# Patient Record
Sex: Female | Born: 1980 | Race: White | Hispanic: No | Marital: Married | Smoking: Current some day smoker
Health system: Southern US, Community
[De-identification: ages and names within clinical notes are randomized; demographics above are authoritative.]

## PROBLEM LIST (undated history)

## (undated) ENCOUNTER — Inpatient Hospital Stay (HOSPITAL_COMMUNITY): Payer: Self-pay

## (undated) DIAGNOSIS — K219 Gastro-esophageal reflux disease without esophagitis: Secondary | ICD-10-CM

## (undated) DIAGNOSIS — N390 Urinary tract infection, site not specified: Secondary | ICD-10-CM

## (undated) DIAGNOSIS — IMO0002 Reserved for concepts with insufficient information to code with codable children: Secondary | ICD-10-CM

## (undated) DIAGNOSIS — O09299 Supervision of pregnancy with other poor reproductive or obstetric history, unspecified trimester: Secondary | ICD-10-CM

## (undated) DIAGNOSIS — B999 Unspecified infectious disease: Secondary | ICD-10-CM

## (undated) DIAGNOSIS — Z8632 Personal history of gestational diabetes: Secondary | ICD-10-CM

## (undated) HISTORY — PX: DILATION AND CURETTAGE OF UTERUS: SHX78

## (undated) HISTORY — PX: INDUCED ABORTION: SHX677

## (undated) HISTORY — DX: Unspecified infectious disease: B99.9

## (undated) HISTORY — DX: Gastro-esophageal reflux disease without esophagitis: K21.9

## (undated) HISTORY — DX: Reserved for concepts with insufficient information to code with codable children: IMO0002

---

## 1999-05-08 ENCOUNTER — Emergency Department (HOSPITAL_COMMUNITY): Admission: EM | Admit: 1999-05-08 | Discharge: 1999-05-08 | Payer: Self-pay | Admitting: Emergency Medicine

## 1999-10-13 DIAGNOSIS — R87619 Unspecified abnormal cytological findings in specimens from cervix uteri: Secondary | ICD-10-CM

## 1999-10-13 DIAGNOSIS — IMO0002 Reserved for concepts with insufficient information to code with codable children: Secondary | ICD-10-CM

## 1999-10-13 HISTORY — PX: LEEP: SHX91

## 1999-10-13 HISTORY — DX: Unspecified abnormal cytological findings in specimens from cervix uteri: R87.619

## 1999-10-13 HISTORY — DX: Reserved for concepts with insufficient information to code with codable children: IMO0002

## 1999-11-18 ENCOUNTER — Emergency Department (HOSPITAL_COMMUNITY): Admission: EM | Admit: 1999-11-18 | Discharge: 1999-11-18 | Payer: Self-pay | Admitting: Emergency Medicine

## 2000-07-30 ENCOUNTER — Encounter (INDEPENDENT_AMBULATORY_CARE_PROVIDER_SITE_OTHER): Payer: Self-pay

## 2000-07-30 ENCOUNTER — Other Ambulatory Visit: Admission: RE | Admit: 2000-07-30 | Discharge: 2000-07-30 | Payer: Self-pay | Admitting: Obstetrics

## 2000-10-31 ENCOUNTER — Emergency Department (HOSPITAL_COMMUNITY): Admission: EM | Admit: 2000-10-31 | Discharge: 2000-10-31 | Payer: Self-pay | Admitting: Emergency Medicine

## 2000-10-31 ENCOUNTER — Encounter: Payer: Self-pay | Admitting: Emergency Medicine

## 2000-12-10 ENCOUNTER — Other Ambulatory Visit: Admission: RE | Admit: 2000-12-10 | Discharge: 2000-12-10 | Payer: Self-pay | Admitting: Obstetrics & Gynecology

## 2000-12-10 ENCOUNTER — Encounter: Admission: RE | Admit: 2000-12-10 | Discharge: 2000-12-10 | Payer: Self-pay | Admitting: Obstetrics & Gynecology

## 2000-12-28 ENCOUNTER — Encounter: Admission: RE | Admit: 2000-12-28 | Discharge: 2000-12-28 | Payer: Self-pay | Admitting: Obstetrics & Gynecology

## 2002-01-13 ENCOUNTER — Emergency Department (HOSPITAL_COMMUNITY): Admission: EM | Admit: 2002-01-13 | Discharge: 2002-01-13 | Payer: Self-pay | Admitting: Emergency Medicine

## 2002-01-13 ENCOUNTER — Encounter: Payer: Self-pay | Admitting: Emergency Medicine

## 2002-04-24 ENCOUNTER — Emergency Department (HOSPITAL_COMMUNITY): Admission: EM | Admit: 2002-04-24 | Discharge: 2002-04-24 | Payer: Self-pay | Admitting: Emergency Medicine

## 2002-09-02 ENCOUNTER — Emergency Department (HOSPITAL_COMMUNITY): Admission: EM | Admit: 2002-09-02 | Discharge: 2002-09-02 | Payer: Self-pay | Admitting: Emergency Medicine

## 2003-01-03 ENCOUNTER — Emergency Department (HOSPITAL_COMMUNITY): Admission: EM | Admit: 2003-01-03 | Discharge: 2003-01-04 | Payer: Self-pay | Admitting: Emergency Medicine

## 2003-05-27 ENCOUNTER — Emergency Department (HOSPITAL_COMMUNITY): Admission: EM | Admit: 2003-05-27 | Discharge: 2003-05-27 | Payer: Self-pay | Admitting: Emergency Medicine

## 2004-02-21 ENCOUNTER — Inpatient Hospital Stay (HOSPITAL_COMMUNITY): Admission: AD | Admit: 2004-02-21 | Discharge: 2004-02-22 | Payer: Self-pay | Admitting: Family Medicine

## 2004-11-13 ENCOUNTER — Encounter (INDEPENDENT_AMBULATORY_CARE_PROVIDER_SITE_OTHER): Payer: Self-pay | Admitting: *Deleted

## 2004-11-13 ENCOUNTER — Ambulatory Visit (HOSPITAL_COMMUNITY): Admission: AD | Admit: 2004-11-13 | Discharge: 2004-11-13 | Payer: Self-pay | Admitting: *Deleted

## 2005-02-25 ENCOUNTER — Emergency Department (HOSPITAL_COMMUNITY): Admission: EM | Admit: 2005-02-25 | Discharge: 2005-02-25 | Payer: Self-pay | Admitting: Emergency Medicine

## 2005-10-25 ENCOUNTER — Inpatient Hospital Stay (HOSPITAL_COMMUNITY): Admission: AD | Admit: 2005-10-25 | Discharge: 2005-10-25 | Payer: Self-pay | Admitting: Family Medicine

## 2006-08-21 ENCOUNTER — Emergency Department (HOSPITAL_COMMUNITY): Admission: EM | Admit: 2006-08-21 | Discharge: 2006-08-22 | Payer: Self-pay | Admitting: Emergency Medicine

## 2006-10-12 HISTORY — PX: WISDOM TOOTH EXTRACTION: SHX21

## 2008-02-08 ENCOUNTER — Encounter: Admission: RE | Admit: 2008-02-08 | Discharge: 2008-02-08 | Payer: Self-pay | Admitting: Obstetrics and Gynecology

## 2008-02-22 ENCOUNTER — Ambulatory Visit (HOSPITAL_COMMUNITY): Admission: RE | Admit: 2008-02-22 | Discharge: 2008-02-22 | Payer: Self-pay | Admitting: Obstetrics and Gynecology

## 2008-03-13 ENCOUNTER — Inpatient Hospital Stay (HOSPITAL_COMMUNITY): Admission: AD | Admit: 2008-03-13 | Discharge: 2008-03-13 | Payer: Self-pay | Admitting: Obstetrics and Gynecology

## 2008-03-17 ENCOUNTER — Inpatient Hospital Stay (HOSPITAL_COMMUNITY): Admission: AD | Admit: 2008-03-17 | Discharge: 2008-03-17 | Payer: Self-pay | Admitting: Obstetrics and Gynecology

## 2008-03-20 ENCOUNTER — Inpatient Hospital Stay (HOSPITAL_COMMUNITY): Admission: AD | Admit: 2008-03-20 | Discharge: 2008-04-03 | Payer: Self-pay | Admitting: Obstetrics and Gynecology

## 2008-03-29 ENCOUNTER — Encounter: Payer: Self-pay | Admitting: Obstetrics and Gynecology

## 2009-10-08 ENCOUNTER — Inpatient Hospital Stay (HOSPITAL_COMMUNITY): Admission: RE | Admit: 2009-10-08 | Discharge: 2009-10-10 | Payer: Self-pay | Admitting: Obstetrics and Gynecology

## 2010-11-02 ENCOUNTER — Encounter: Payer: Self-pay | Admitting: *Deleted

## 2011-01-12 LAB — COMPREHENSIVE METABOLIC PANEL
Albumin: 2.3 g/dL — ABNORMAL LOW (ref 3.5–5.2)
Alkaline Phosphatase: 232 U/L — ABNORMAL HIGH (ref 39–117)
CO2: 19 mEq/L (ref 19–32)
Calcium: 8.4 mg/dL (ref 8.4–10.5)
Chloride: 105 mEq/L (ref 96–112)
GFR calc Af Amer: >60 mL/min (ref >60)
GFR calc non Af Amer: >60 mL/min (ref >60)
Glucose, Bld: 95 mg/dL (ref 70–99)
Total Bilirubin: 0.3 mg/dL (ref 0.3–1.2)
Total Protein: 6 g/dL (ref 6.0–8.3)

## 2011-01-12 LAB — CBC
Hemoglobin: 12.2 g/dL (ref 12.0–15.0)
MCHC: 33.6 g/dL (ref 30.0–36.0)
MCHC: 33.8 g/dL (ref 30.0–36.0)
MCV: 88.5 fL (ref 78.0–100.0)
Platelets: 144 10*3/uL — ABNORMAL LOW (ref 150–400)
RBC: 3.39 MIL/uL — ABNORMAL LOW (ref 3.87–5.11)
RBC: 4.1 MIL/uL (ref 3.87–5.11)

## 2011-01-12 LAB — LACTATE DEHYDROGENASE: LDH: 137 U/L (ref 94–250)

## 2011-01-12 LAB — HEPATIC FUNCTION PANEL
ALT: 38 U/L — ABNORMAL HIGH (ref 0–35)
Albumin: 1.9 g/dL — ABNORMAL LOW (ref 3.5–5.2)
Alkaline Phosphatase: 177 U/L — ABNORMAL HIGH (ref 39–117)
Bilirubin, Direct: 0.1 mg/dL (ref 0.0–0.3)
Total Bilirubin: <0.1 mg/dL — ABNORMAL LOW (ref 0.3–1.2)

## 2011-01-12 LAB — RAPID HIV SCREEN (WH-MAU): Rapid HIV Screen: NONREACTIVE

## 2011-02-24 NOTE — Consult Note (Signed)
NAMESTEPHANINE, Richards               ACCOUNT NO.:  [id number]   MEDICAL RECORD NO.:  [id number]          PATIENT TYPE:  INP   LOCATION:  9156                          FACILITY:  [facility]   PHYSICIAN:  Crist Fat. Rivard, M.D. DATE OF BIRTH:  02/13/1981   DATE OF CONSULTATION:  03/26/2008  DATE OF DISCHARGE:                                 CONSULTATION   REASON FOR CONSULTATION:  Secondary to elevated liver function tests.   Thank you for allowing me to see [name] today, per your request for  consultation.  As you know, she is a 30 year old G 4, P 0, 0, 3, 0,  currently at 34 weeks and 3 days, admitted on March 20, 2008, secondary to  concerns for preterm labor.  Her past medical history is notable for a  LEEP, as well as being diagnosed with gestational diabetes.  During her  hospitalization she has been noted to remain quiescent and not change  her cervix from a cervix of 1 cm dilated, 80% effaced and 0 station.  During this hospitalization she has received magnesium sulfate for  tocolysis, as well as has received a course of betamethasone.  She  continued to receive Glyburide until her blood sugars were elevated and  was subsequently converted to insulin.   PAST MEDICAL HISTORY:  Is otherwise unremarkable aside from that which  was mentioned above.   ALLERGIES:  No known drug allergies.   OB HISTORY:  Notable for a SAB in 2001.  An elective AB in 2002, and an  SAB in 2006.   GYN HISTORY:  Notable for an abnormal Pap with a LEEP in 2001.   SOCIAL HISTORY:  Notable for tobacco use.   PAST SURGICAL HISTORY:  Notable for the LEEP in 2001, and a D&C in 2002.   FAMILY HISTORY:  Noncontributory.   CURRENT PREGNANCY:  Was uncomplicated until her Glucola at 26 weeks  revealed an abnormal value and a 3-hour GGT confirmed her gestational  diabetes.  She has been on Glyburide for glycemia control since the  diagnosis.   OBJECTIVE:  VITAL SIGNS:  Stable.  She is afebrile.  Blood  pressures  100's to 110's/60's to 70's.   LABORATORY DATA:  Hepatitis panel which was negative for Hepatitis-C and  Hepatitis-B, but also her 24-hour urine which was completed on March 24, 2008, revealing 74 mg of protein noted.  Her comprehensive metabolic  panel reveals her AST and ALT have been gradually rising over the course  of the last week until now with AST of 100 units per liter and her ALT  278 units per liter.  This is elevated, compared to her levels on March 22, 2008, when the AST was normal at 33 and her ALT was slightly  elevated at 55.  Her platelet counts have remained stable.  Today it is  241,000 which is a slight decrease from 270,000 on March 22, 2008.   Further discussion with the patient reveals that she does not complain  of any symptoms associated with pre-eclampsia.  She denies any  medication use, aside from  the Glyburide which she has been on for some  time and has discontinued it for nearly 48 hours.   ASSESSMENT:  1. Intrauterine pregnancy at 34 weeks and 3 days, with elevated liver      function studies.  2. Gestational diabetes, currently on insulin.  3. Pre-term labor, currently stable.   PLAN:  1. Elevated liver function studies:  Although this is an isolated      finding, she is a very likely candidate to possibly have atypical      HELLP syndrome, although I realize that she does not have any      proteinuria or any thrombocytopenia and her blood pressures have      remained stable.  This is high on my differential at this time.      Other considerations include acute fatty liver; however, clinically      she appears quite stable, with regard to the typical presentation      of acute fatty liver with significant decompensation.  Other      considerations include hepatitis, which seems unlikely with the      negative hepatitis panel, as well as drug-induced, and again this      seems unlikely given her denial of any medications, as well as the       recent discontinuation of the Glyburide.  I recommend that we re-      evaluate her liver function tests tomorrow morning, as well as      repeat another 24-hour urine protein and follow her blood pressures      over the course of the evening.  Based on these findings tomorrow,      we can reassess the status and develop a plan with regards to      pregnancy management and patient care.  2. Gestational diabetes:  She is currently receiving insulin for her      glycemic control.  3. Pre-term labor:  She has remained stable, without any evidence of      labor progression.  She has also received a course of natal      corticosteroids.  4. Given the constellation of findings, I have recommended that we      also have a NICU consultation, for the possibility that she may      need to be delivered.  This can be postponed until our findings      from our laboratory assessments in the morning.   These findings and plan were discussed with Dr. Crist Fat. Rivard earlier  today after my consultation.   Again, thank you for allowing me to see [name] today in  consultation.  The total face to face time was one hour.  If you have  any further questions, please free to contact me.  I look forward to  continuing to follow her during her current hospitalization.      Toma Copier, MD  Electronically Signed     ______________________________  Crist Fat Rivard, M.D.    SJ/MEDQ  D:  03/26/2008  T:  03/26/2008  Job:  [id number]

## 2011-02-24 NOTE — Consult Note (Signed)
NAME:  Kelsey Richards, Kelsey Richards NO.:  [id number]   MEDICAL RECORD NO.:  [id number]          PATIENT TYPE:  OUT   LOCATION:  MFM                           FACILITY:  [facility]   PHYSICIAN:  Graylin Shiver, M.D.   DATE OF BIRTH:  September 29, 1981   DATE OF CONSULTATION:  03/29/2008  DATE OF DISCHARGE:                                 CONSULTATION   We were asked to see [name] today in consultation for elevated  liver enzymes by Dr. Jena Gauss.   HISTORY OF PRESENT ILLNESS:  This is a 30 year old female who was  admitted for preterm labor on March 20, 2008.  She was given betamethasone  and magnesium sulfate to slow and stop the labor.  At the time of her  admission, her alk phos was slightly elevated; however, this is not  unusual for pregnancy.  The patient denies any abdominal pain, vomiting,  change in bowel habits, herbal supplements, or recreational drugs,  history of liver disease in herself or in her family.  She reports  severe GERD symptoms, palmar erythema that began at 19 weeks, and has  bilateral chronic itch that seems to be in her extremities as well as on  her back, mild constipation, and history of birth control pill use.  The  patient has maintained a normal to low blood pressure throughout her  stay.  Her platelets are normal.   Past medical history is significant for two spontaneous abortions, one  elective abortion, an LEEP procedure, and gestational diabetes.   Current medications include glyburide, terbutaline, a beta-2 agonist,  Tums, and prenatal vitamins.   She has no known drug allergies.   REVIEW OF SYSTEMS:  As per HPI.   Social history is positive for tobacco, but negative for drugs and  alcohol.  She is a single parent.   Her family history is negative for liver disease, pancreatic disease.  Her father did passed recently with throat cancer.   On physical exam, she is alert and oriented in no apparent distress.  She is pleasant to speak with.  She  has no obvious jaundice.  Her  temperature is 97.9, pulse 96, respirations 20, and blood pressure is  122/76.  Heart has a slightly tachy rate but regular rhythm.  No  murmurs, rubs, or gallops appreciated.  Her lungs are clear to  auscultation bilaterally.  Her abdomen is gravid, firm, nontender, has  good bowel sounds.  She does have bilateral palmar erythema.  She has no  spider telangiectasias.   LABORATORY DATA:  Hepatitis A, B, and C are negative.  Hepatitis B  surface antigen negative.  Hep C antibody negative.  BMET is within  normal limits.  AST is 140, ALT 402, alk phos 224, total bilirubin 0.6.  Coags were normal.  Antimitochondrial antibody is pending.  Radiological  exams include an abdominal ultrasound which showed no stones, no fatty  liver, no CBD dilatation, and she had a normal appearing pancreas.   ASSESSMENT:  Dr. Herbert Moors seen and examined the patient, collected a  history, and reviewed the chart.  His  impression is at this point in  time she appears not to be ill, yet has elevated LFTs.  The following  considerations have been followed.  1. Viral hepatitis ruled out by negative hepatitis serologies.  2. Gallstones ruled out by negative abdominal ultrasound.  3. Fatty liver ruled out with negative ultrasound.  4. Cytomegalovirus hepatitis.  We will check Cytomegalovirus titers.  5. Primary biliary cirrhosis.  We will check an AMA.  6. Autoimmune hepatitis.  We will check an ANA.  7. Hemolysis, elevated liver enzymes, and low platelet count does not      fit clinically.  Platelets are okay.  She is not hypertensive and      there is no evidence of hemolysis.  8. Fatty liver of pregnancy.  It does not clinically fit.  She is not      ill.  She has no evidence of hepatic failure.  Ammonia is okay.      Coagulation studies are okay.  9. Subacute Budd-Chiari syndrome.  We will rule this out with Doppler      ultrasounds.  10.Cholestasis of pregnancy.  This  diagnosis best fits as picture.  I      suspect this is what we are dealing with and it will resolve after      the patient has delivered her baby.   Thanks very much for this consultation.  We will follow with you.      Stephani Police, PA    ______________________________  Graylin Shiver, M.D.    MLY/MEDQ  D:  03/29/2008  T:  03/30/2008  Job:  [id number]

## 2011-02-24 NOTE — H&P (Signed)
Kelsey Richards, Kelsey Richards               ACCOUNT NO.:  [id number]   MEDICAL RECORD NO.:  [id number]          PATIENT TYPE:  INP   LOCATION:  9156                          FACILITY:  [facility]   PHYSICIAN:  Osborn Coho, M.D.   DATE OF BIRTH:  1981-03-20   DATE OF ADMISSION:  03/20/2008  DATE OF DISCHARGE:                              HISTORY & PHYSICAL   This is a 30 year old gravida 4, para 0-0-3-0 at 33-4/7 weeks who  presents for evaluation of preterm labor.  The patient feels some, but  not all of her contractions.  She denies leaking or bleeding and reports  positive fetal movement.  Pregnancy has been followed by Dr. Su Hilt and  remarkable for  1. History of LEEP.  2. Smoker.  3. History of SAB x2 and EAB x1.  4. Gestational diabetes.  5. Group B strep negative.   ALLERGIES:  None.   OB HISTORY:  Remarkable for spontaneous abortion in 2001, elective  abortion in 2002, and a spontaneous abortion in 2006.   MEDICAL HISTORY:  Remarkable for abnormal Pap with a LEEP in 2001 and  history of smoking.   SURGICAL HISTORY:  Remarkable for a LEEP in 2001 and a D&C in 2002.   FAMILY HISTORY:  Remarkable for grandmother with heart disease,  hypertension, and diabetes.  Mother with skin cancer, and father with  throat cancer.   GENETIC HISTORY:  Remarkable for a cousin with CP.   SOCIAL HISTORY:  The patient is single.  Father of the baby is involved  and supportive.  She does not report a religious affiliation.  She  denies any alcohol or drug abuse, but does smoke cigarettes.   PRENATAL LABS:  Hemoglobin 13.5, platelets 185.  Blood type A+, antibody  screen negative.  Toxin negative, RPR nonreactive, rubella immune.  Hepatitis negative, HIV negative.  Cystic fibrosis negative, varicella  negative.   HISTORY OF CURRENT PREGNANCY:  The patient entered care at [redacted] weeks  gestation.  She had an ultrasound that was normal and another ultrasound  at 9 weeks that was normal.  Her first  trimester screen was normal.  Cervical length at 19 weeks is 2.78, and her anatomy scan was normal at  that time.  Her Glucola at 26 weeks was elevated at 183, 3-hour GTT was  elevated, and so, she was referred for diabetic teaching and placed on a  diet, which was insufficient to control her sugars, so she was placed on  glyburide and later the glyburide had to be increased to 10 mg per day  at 28 weeks.  Ultrasound at 28 weeks showed normal growth and normal  fluid.  She started her nonstress tests at 30 weeks and sugars remained  in good control after that time, and she presents today with  contractions.   OBJECTIVE:  VITAL SIGNS:  Stable, afebrile.  HEENT:  Within normal limits.  Thyroid normal, not enlarged.  CHEST:  Clear to auscultation.  HEART:  Regular rate and rhythm.  ABDOMEN:  Gravid, 33-cm vertex [place].  EMF shows reactive fetal heart  rate with contractions every  3-9 minutes, which are mild.  Cervix is 1  cm, 80%, -1 to 0 station with a vertex presentation.  This represents a  change from 3 days ago when her cervix was closed and not effaced.  Group B strep is negative.  Fetal fibronectin was negative on Mar 09, 2008.  CBG after lunch was 122.  EXTREMITIES:  Within normal limits.   ASSESSMENT:  1. Intrauterine pregnancy at 33-4/7 weeks.  2. Preterm labor.  3. Gestational diabetes.   PLAN:  1. Consulted Dr. Su Hilt who wants to admit the patient.  2. Magnesium sulfate tocolysis.  3. Betamethasone series.  4. Glyburide for controlled blood sugar monitoring, and we will call      the physician with sugars greater than or equal to 150.  Further      orders to follow.      Marie L. Williams, C.N.M.      Osborn Coho, M.D.  Electronically Signed    MLW/MEDQ  D:  03/20/2008  T:  03/21/2008  Job:  [id number]

## 2011-02-27 NOTE — Discharge Summary (Signed)
Kelsey Richards, Kelsey Richards               ACCOUNT NO.:  [id number]   MEDICAL RECORD NO.:  [id number]          PATIENT TYPE:  INP   LOCATION:  9110                          FACILITY:  [facility]   PHYSICIAN:  Osborn Coho, M.D.   DATE OF BIRTH:  01/06/1981   DATE OF ADMISSION:  03/20/2008  DATE OF DISCHARGE:  04/03/2008                               DISCHARGE SUMMARY   ADMITTING DIAGNOSES:  1. Intrauterine pregnancy at 33-4/7 weeks.  2. Preterm labor.  3. Gestational diabetes.   DISCHARGE DIAGNOSES:  1. Preterm delivery at 35-3/7 weeks secondary to induction of labor      for worsening elevations in liver function tests (suspected      cholestasis of pregnancy).  2. Status post spontaneous vaginal delivery with findings of viable      female infant on April 01, 2008, at 1:27 a.m. with weight equal to 5      pounds 12 ounces, (which was 2615 grams), 20 inches in length with      Apgars of 9 at 1 minute and 9 at 5 minutes.  3. Elevated but improving liver function tests.  4. Breast-feeding along with supplementation.   PROCEDURES:  1. Magnesium sulfate infusion on admission for preterm labor and to      allow for steroid course.  2. Betamethasone course x2.  3. Epidural.  4. GI workup with abdominal ultrasound.   HOSPITAL COURSE:  [name] is a 30 year old gravida 4, para 0-0-3-0  who presented on the day of admission of March 20, 2008, at 33-4/7 weeks  for preterm labor evaluation.  The patient stated that she was feeling  some contractions but not all that were noted on the monitor.  She  denied leakage of fluid or vaginal bleeding.  Reported good fetal  movement.  Her pregnancy had been followed by the MD service at [facility],  Dr. Su Hilt was her primary, and her history and pregnancy were  remarkable for:  1. History of LEEP.  2. Smoker.  3. History of SAB x2 and EAB x1.  4. Gestational diabetes.  5. Group B strep negative.   On assessment, the patient's cervix was 1 cm, 80% and -1 to  0 station  with a vertex presentation, which was a change from 3 days prior to her  admission, when cervix was closed and not effaced.  She had a reactive  fetal heart tracing with contractions every 3 to 9 minutes, which were  mild on palpation.  She had a negative fetal fibronectin that was on Mar 09, 2008.  A CBG after lunch before her arrival was 122.  The patient  was admitted under Dr. Osborn Coho for inpatient status to antenatal  unit to begin magnesium sulfate infusion for tocolysis and was to obtain  betamethasone series for fetal lung maturity.  She was also placed on  glyburide for blood sugar control,  and sugars were to be monitored.  Just after magnesium sulfate infusion begun, the patient reported that  contractions had spaced and was feeling better.  The following morning,  June 10, the patient continued  doing well.  She was aware just of some  sporadic contractions.  Vital signs were stable.  She was afebrile.  Her  morning CBG was 176.  The first dose of her betamethasone was on the 9th  at 7:00 p.m.  Her lungs were clear.  She was having 1 to 2 contractions  per hour on the magnesium sulfate.  Fetal heart tracing was reactive  without decels.  Dr. Su Hilt checked her cervix that morning, and it  remained 1 cm, 80% and -1.  DTRs were 2+, and the magnesium sulfate  continued to infuse to allow for second dose of betamethasone.  Her  glyburide that day was increased to 15 mg p.o. daily.  Later in the day,  the patient was seen by Dr. Normand Sloop and was also placed on a Humalog  sliding scale insulin.  She had a social work consult on the 10th as  well.  Later on on the 10th, about just before 9:00 p.m., the patient  was feeling dizzy but no shortness of breath, no chest pain.  Vital  signs were stable.  Reactive NST.  Blood sugar was 149.  Magnesium level  was checked, and the plan was made to continue to monitor closely.  On  June 11, the patient was stable.  She did not  have any complaints over  the night.  Fasting blood sugar was 126.  The EFM showed 4 to 5 moderate  variable decels during the night associated with contractions but had  been reactive before and after the decels.  She had either no  contractions or one per hour with mild irritability in between.  Her  magnesium sulfate was decreased to 1.5 grams overnight secondary to the  dizziness.  She did have a nutrition consult on June 10 as well.  Magnesium was discontinued on June 11, and plan was made by Dr. Pennie Rushing  to check magnesium level, CMP, CBC and ultrasound results for growth.  On June 12, the patient was doing well.  Fasting blood sugar was 104.  The 2-hour postprandial after breakfast the previous day was 137, after  lunch 106, and after dinner was 180.  Lungs were clear.  SGOT was 43; it  had previously been 34.  SGPT was 63 and had previously been 56.  Toco  showed sporadic contractions, cycles of irritability.  She had reactive  fetal heart tracing with a couple of deep variables.  Ultrasound the  previous day showed a BPP of 6/8.  She had -2 for fetal breathing  movement.  The AFI was 11.98, which was the 33rd percentile.  Plan was  made to repeat the BPP that day.  To note Dr. Normand Sloop put LFTs still  increasing, and plan was made to check hepatitis panel, and plan was  made to recheck her CMP in the morning of the 13th.  On June 13, she was  34-1/7 weeks.  She was doing well and voiced the desire that she wanted  to go home.  She was not complaining of any contractions, had good fetal  movement.  Vital signs were stable.  Her CBG fasting on the 13th was 70,  on the previous day fasting was 104, and then after breakfast was 64.  After lunch it was 136 and after supper was 112.  Physical exam still  remained within normal limits.  Fetal heart rate continued to have very  mild occasional variables, 2 to 3 possible contractions sporadically,  but none definitive  before the assessment.   On the 13th, SGOT was 74;  the day before it had been 43.  SGPT was 126; on the day before it had  been 63.56.  Noted that preterm labor had presumptively resolved, but  liver enzymes continued to elevate.  The plan was made to begin a 24-  hour urine as well as a preeclampsia workup.  The plan was made by Dr.  Normand Sloop if liver enzymes continued to increase on the glyburide to  consult with MSN for input and recommendations  She was started on  Lovenox daily for DVT prevention.  On June 14, at 34-2/7 weeks, her  fasting blood sugar was 82, the 2-hour p.c. 160 after her lunch, 127  after dinner.  Fetal heart rate remained reactive.  Irregular  contractions and sporadic irritability.  Total bilirubin was 0.4,  alkaline phos was 191.  CMET was within normal limits.  AST was up at  82; it had been 74 the previous day.  ALT was up to 192; it had been 126  the previous day.  Hepatitis C was negative and hepatitis B was  negative.  A 24-hour urine showed 74 mg of protein, and plan was made to  have MSN consult the following day.  On June 15, at 34-3/7 weeks, the  patient's fasting blood sugar was 165, 2-hour postprandial after lunch  was 192, after supper was 98.  Physical exam was within normal limits.  She had a reactive fetal heart tracing, irregular contractions.  SGOT  was 100, previous day was 82.  SGPT was 278, which was still up from 192  the previous day.  Alkaline phos was increased to 208, had been 191.  Sodium was slightly decreased at 130.  On the 15th, the patient was  taken off the glyburide.  MSN had concerns for atypical HELLP syndrome.  The  plan was made to repeat the 24-hour urine and preeclampsia labs the  following day.  They also recommended discontinuing the Lovenox because  the patient was ambulating, but to continue glycemic control using the  sliding-scale insulin.  On June 16, she was 34-4/7 weeks.  She continued  doing well, had done the insulin teaching for self  administration of her  insulin and checking her blood sugars.  Fasting blood sugar was 95,  morning postprandial was 94, after lunch was 126.  Reactive fetal heart  tracing.  Occasional mild contractions.  SGOT was 89, which was down  from 100.  SGPT was 285, which continued to elevate, which was 278 the  previous day.  Uric acid was 3.8, LDH 128.  The patient clinically was  without any PIH signs or symptoms.  Per MSN, isolated liver function  tests was the diagnosis, all of her other lab work to rule out  preeclampsia was within normal limits.  Ultrasound was ordered, a liver  ultrasound with Dopplers, to evaluate for Budd-Chiari, also hepatitis A  and repeat LFTs.  They did do other labs, including albumin, bile acid,  antimitochondrial antibodies and GGT.  The 24-hour urine did wind up  showing 125 of  protein.  On June 17, at 34-5/7 weeks, the patient  continued doing well.  Did not have abdominal pain.  She had some  minimal cramping, but denied nausea, vomiting or diarrhea.  Fetal heart  tracing remained stable, reactive, just a rare mild variable.  She had  no signs or symptoms of jaundice, and MSN after their consult and review  of labs recommended GI consult.  On June 18, at 34-6/7 weeks, the  patient continued doing well.  She had lost 5 pounds in her weight, but  fetal heart tracing remained reassuring and clinically was doing well.  The patient continued on her Humulin NPH and R.  Dr. Estanislado Pandy noted  unexplained increase in LFTs in a patient who is clinically well,  questioned if it could be drug induced with glyburide.  She had a repeat  OB ultrasound on the 18th.  She did have a GI consult on the 18th, which  was by Dr. Evette Cristal, and, after the consult, GI doctor considered viral  hepatitis but which was ruled out by negative hepatitis serology.  Gallstones were ruled out by negative abdominal ultrasound.  Fatty liver  ruled out with negative ultrasound.  The plan was made to  check  cytomegalovirus titers for cytomegalic virus hepatitis.  They also  ordered an AMA to check for primary biliary cirrhosis as well as an ANA  for autoimmune hepatitis.  Clinically no signs of preeclampsia.  Clinically did not fit signs and symptoms of fatty liver of pregnancy  and with no evidence of hepatic failure, ammonia was okay, coagulation  studies were okay, were considering Budd-Chiari syndrome and would rule  that out with a Doppler ultrasound, and likely this picture for a fit of  elevated liver enzymes associated with cholestasis of pregnancy.  Per  GI, this was a suspicion, and that it would resolve after delivery of  the baby.  On the 19th, the patient continued to do well, was without  complaints.  Mild pruritus.  No PIH signs or symptoms.  The pruritus was  improved with Benadryl.  Fetal ultrasound on the 18th showed BPP of 8/8.  She had reactive fetal heart tracing.  Alkaline phosphatase on the 19th  was 234, AST was 178 and ALT was 521.      Candice Denny Levy, CNM      ______________________________  Osborn Coho, M.D.    CHS/MEDQ  D:  05/23/2008  T:  05/23/2008  Job:  [id number]

## 2011-02-27 NOTE — Op Note (Signed)
[name], Kelsey Richards               ACCOUNT NO.:  [id number]   MEDICAL RECORD NO.:  [id number]          PATIENT TYPE:  MAT   LOCATION:  MATC                          FACILITY:  [facility]   PHYSICIAN:  Hal Morales, M.D.DATE OF BIRTH:  11-Oct-1981   DATE OF PROCEDURE:  11/13/2004  DATE OF DISCHARGE:                                 OPERATIVE REPORT   PREOPERATIVE DIAGNOSIS:  Inevitable missed abortion at eight weeks.   POSTOPERATIVE DIAGNOSIS:  Inevitable missed abortion at eight weeks.   OPERATION:  Suction dilatation and evacuation.   SURGEON:  Hal Morales, M.D.   ANESTHESIA:  Monitored anesthesia care and local.   ESTIMATED BLOOD LOSS:  Less than 50 mL.   COMPLICATIONS:  None.   FINDINGS:  The uterus was enlarged to approximately eight to 10 weeks' size  and contained a moderate amount of products of conception.   PROCEDURE:  The patient was taken to the operating room after appropriate  identification and placed on the operating table.  After placement of  equipment for monitored anesthesia care, she was placed in lithotomy  position.  Perineum and vagina were prepped with multiple layers of Betadine  and a red Robinson catheter used to empty the bladder.  The perineum was  draped in a sterile field.  A Graves speculum was placed and the vagina and  a single-tooth tenaculum placed on the cervix.  A paracervical block was  achieved with a total of 10 mL of 2% Xylocaine in the 5 and 7 o'clock  positions.  The cervix was noted to be dilated enough to place a #10 suction  curette.  This was used to suction-evacuate all quadrants of the uterus.  A  sharp curette was used to ensure that all products of conception had been  removed.  All instruments were removed from the vagina and a bleeding site  on the cervix from the tenaculum was oversewn with a figure-of-eight suture  of 2-0 chromic.  Once hemostasis was adequate, all instruments were removed  and the patient taken from  the operating room to the recovery room in  satisfactory condition having tolerated the procedure, well with sponge and  instrument counts correct.   SPECIMENS TO PATHOLOGY:  Products of conception.   Blood type A positive.    VPH/MEDQ  D:  11/13/2004  T:  11/14/2004  Job:  [id number]

## 2011-07-09 LAB — COMPREHENSIVE METABOLIC PANEL
ALT: 126 — ABNORMAL HIGH
ALT: 55 — ABNORMAL HIGH
ALT: 285 — ABNORMAL HIGH
ALT: 336 — ABNORMAL HIGH
ALT: 402 — ABNORMAL HIGH
ALT: 424 — ABNORMAL HIGH
ALT: 521 — ABNORMAL HIGH
ALT: 601 — ABNORMAL HIGH
AST: 33
AST: 43 — ABNORMAL HIGH
AST: 100 — ABNORMAL HIGH
AST: 100 — ABNORMAL HIGH
AST: 111 — ABNORMAL HIGH
AST: 114 — ABNORMAL HIGH
AST: 140 — ABNORMAL HIGH
AST: 202 — ABNORMAL HIGH
AST: 89 — ABNORMAL HIGH
Albumin: 1.8 — ABNORMAL LOW
Albumin: 2 — ABNORMAL LOW
Albumin: 2.2 — ABNORMAL LOW
Albumin: 2 — ABNORMAL LOW
Albumin: 2.1 — ABNORMAL LOW
Albumin: 2.2 — ABNORMAL LOW
Albumin: 2.2 — ABNORMAL LOW
Alkaline Phosphatase: 158 — ABNORMAL HIGH
Alkaline Phosphatase: 185 — ABNORMAL HIGH
Alkaline Phosphatase: 199 — ABNORMAL HIGH
Alkaline Phosphatase: 198 — ABNORMAL HIGH
Alkaline Phosphatase: 212 — ABNORMAL HIGH
Alkaline Phosphatase: 224 — ABNORMAL HIGH
BUN: 4 — ABNORMAL LOW
BUN: 5 — ABNORMAL LOW
BUN: 7
BUN: 8
BUN: 8
BUN: 6
BUN: 9
CO2: 21
CO2: 24
CO2: 19
CO2: 21
CO2: 21
CO2: 22
CO2: 22
CO2: 23
Calcium: 7.1 — ABNORMAL LOW
Calcium: 8.3 — ABNORMAL LOW
Calcium: 8.7
Calcium: 8.9
Calcium: 9
Calcium: 9.7
Chloride: 103
Chloride: 106
Chloride: 107
Chloride: 102
Chloride: 106
Chloride: 106
Chloride: 107
Chloride: 107
Chloride: 108
Creatinine, Ser: 0.47
Creatinine, Ser: 0.51
Creatinine, Ser: 0.58
Creatinine, Ser: 0.47
Creatinine, Ser: 0.5
Creatinine, Ser: 0.52
Creatinine, Ser: 0.56
Creatinine, Ser: 0.61
GFR calc Af Amer: >60
GFR calc Af Amer: >60
GFR calc Af Amer: >60
GFR calc Af Amer: >60
GFR calc Af Amer: >60
GFR calc Af Amer: >60
GFR calc Af Amer: >60
GFR calc Af Amer: >60
GFR calc non Af Amer: >60
GFR calc non Af Amer: >60
GFR calc non Af Amer: >60
GFR calc non Af Amer: >60
GFR calc non Af Amer: >60
GFR calc non Af Amer: >60
Glucose, Bld: 58 — ABNORMAL LOW
Glucose, Bld: 67 — ABNORMAL LOW
Glucose, Bld: 82
Glucose, Bld: 176 — ABNORMAL HIGH
Glucose, Bld: 83
Glucose, Bld: 88
Potassium: 3.3 — ABNORMAL LOW
Potassium: 3.6
Potassium: 4.1
Potassium: 3.2 — ABNORMAL LOW
Potassium: 3.5
Potassium: 3.6
Potassium: 3.7
Potassium: 3.9
Sodium: 131 — ABNORMAL LOW
Sodium: 134 — ABNORMAL LOW
Sodium: 133 — ABNORMAL LOW
Sodium: 134 — ABNORMAL LOW
Sodium: 135
Sodium: 135
Sodium: 138
Total Bilirubin: 0.1 — ABNORMAL LOW
Total Bilirubin: 0.3
Total Bilirubin: 0.4
Total Bilirubin: 0.4
Total Bilirubin: 0.2 — ABNORMAL LOW
Total Bilirubin: 0.5
Total Bilirubin: 0.5
Total Bilirubin: 0.5
Total Bilirubin: 0.6
Total Bilirubin: 0.9
Total Protein: 4.8 — ABNORMAL LOW
Total Protein: 5.5 — ABNORMAL LOW
Total Protein: 5.9 — ABNORMAL LOW
Total Protein: 5.4 — ABNORMAL LOW
Total Protein: 5.5 — ABNORMAL LOW
Total Protein: 5.9 — ABNORMAL LOW
Total Protein: 6

## 2011-07-09 LAB — CBC
HCT: 29.9 — ABNORMAL LOW
HCT: 34.3 — ABNORMAL LOW
HCT: 31.5 — ABNORMAL LOW
HCT: 32.6 — ABNORMAL LOW
HCT: 35.3 — ABNORMAL LOW
Hemoglobin: 11.8 — ABNORMAL LOW
Hemoglobin: 10.8 — ABNORMAL LOW
Hemoglobin: 11.9 — ABNORMAL LOW
MCHC: 33.3
MCHC: 34.1
MCHC: 34.6
MCHC: 34.8
MCV: 89.3
MCV: 87.3
MCV: 88.3
MCV: 88.7
Platelets: 274
Platelets: 199
Platelets: 234
RBC: 3.84 — ABNORMAL LOW
RBC: 3.89
RBC: 3.93
RBC: 3.97
RBC: 3.99
RDW: 13.8
RDW: 14.6
WBC: 10.2
WBC: 12.4 — ABNORMAL HIGH
WBC: 16.6 — ABNORMAL HIGH
WBC: 8.1

## 2011-07-09 LAB — PROTEIN, URINE, 24 HOUR
Collection Interval-UPROT: 24
Collection Interval-UPROT: 24
Protein, 24H Urine: 74
Protein, Urine: 2
Protein, Urine: 5
Urine Total Volume-UPROT: 3700
Urine Total Volume-UPROT: 2500

## 2011-07-09 LAB — CREATININE CLEARANCE, URINE, 24 HOUR
Creatinine Clearance: 152 — ABNORMAL HIGH
Creatinine, 24H Ur: 1095
Creatinine: 0.5
Urine Total Volume-CRCL: 3700
Urine Total Volume-CRCL: 2500

## 2011-07-09 LAB — DIFFERENTIAL
Basophils Absolute: 0
Basophils Absolute: 0
Basophils Absolute: 0
Basophils Relative: 0
Basophils Relative: 0
Basophils Relative: 0
Basophils Relative: 0
Eosinophils Absolute: 0
Eosinophils Relative: 0
Eosinophils Relative: 1
Lymphocytes Relative: 15
Lymphocytes Relative: 20
Lymphs Abs: 1.9
Monocytes Absolute: 0.4
Monocytes Absolute: 0.4
Neutro Abs: 10.1 — ABNORMAL HIGH
Neutro Abs: 14.7 — ABNORMAL HIGH
Neutrophils Relative %: 89 — ABNORMAL HIGH

## 2011-07-09 LAB — URINALYSIS, ROUTINE W REFLEX MICROSCOPIC
Glucose, UA: NEGATIVE
Hgb urine dipstick: NEGATIVE
Hgb urine dipstick: NEGATIVE
Leukocytes, UA: NEGATIVE
Nitrite: NEGATIVE
Specific Gravity, Urine: 1.025
Specific Gravity, Urine: <1.005 — ABNORMAL LOW
Urobilinogen, UA: 0.2
Urobilinogen, UA: 0.2
pH: 6
pH: 6.5

## 2011-07-09 LAB — LIPASE, BLOOD: Lipase: 17

## 2011-07-09 LAB — URIC ACID
Uric Acid, Serum: 3.5
Uric Acid, Serum: 4.5

## 2011-07-09 LAB — HEPATIC FUNCTION PANEL
Albumin: 2.3 — ABNORMAL LOW
Indirect Bilirubin: 0.4
Total Protein: 6.7

## 2011-07-09 LAB — LACTATE DEHYDROGENASE: LDH: 149

## 2011-07-09 LAB — ANTI-SMOOTH MUSCLE ANTIBODY, IGG: F-Actin IgG: <20 U (ref <20)

## 2011-07-09 LAB — PROTIME-INR
INR: 0.9
Prothrombin Time: 12.1

## 2011-07-09 LAB — MAGNESIUM: Magnesium: 6.3

## 2011-07-09 LAB — WET PREP, GENITAL
Clue Cells Wet Prep HPF POC: NONE SEEN
Yeast Wet Prep HPF POC: NONE SEEN

## 2011-07-09 LAB — ALBUMIN: Albumin: 2.2 — ABNORMAL LOW

## 2011-07-09 LAB — ANA: Anti Nuclear Antibody(ANA): NEGATIVE

## 2011-07-09 LAB — URINE CULTURE

## 2011-07-09 LAB — GC/CHLAMYDIA PROBE AMP, GENITAL: GC Probe Amp, Genital: NEGATIVE

## 2011-07-09 LAB — CMV ABS, IGG+IGM (CYTOMEGALOVIRUS)
CMV IgM: <0.9 Index (ref <0.90)
Cytomegalovirus Ab-IgG: <0.91 Index (ref <0.91)

## 2011-07-09 LAB — AMMONIA: Ammonia: 34

## 2011-07-09 LAB — URINE MICROSCOPIC-ADD ON

## 2011-07-09 LAB — STREP B DNA PROBE

## 2011-07-09 LAB — MITOCHONDRIAL ANTIBODIES: Mitochondrial M2 Ab, IgG: 70.3 Units — ABNORMAL HIGH (ref <20.1)

## 2012-10-03 ENCOUNTER — Other Ambulatory Visit (INDEPENDENT_AMBULATORY_CARE_PROVIDER_SITE_OTHER): Payer: Self-pay

## 2012-10-03 DIAGNOSIS — N912 Amenorrhea, unspecified: Secondary | ICD-10-CM

## 2012-10-07 ENCOUNTER — Ambulatory Visit (INDEPENDENT_AMBULATORY_CARE_PROVIDER_SITE_OTHER): Payer: Medicaid Other | Admitting: Obstetrics and Gynecology

## 2012-10-07 DIAGNOSIS — Z331 Pregnant state, incidental: Secondary | ICD-10-CM

## 2012-10-07 LAB — POCT URINALYSIS DIPSTICK
Blood, UA: NEGATIVE
Glucose, UA: NEGATIVE
Nitrite, UA: NEGATIVE
Spec Grav, UA: 1.02
Urobilinogen, UA: NEGATIVE

## 2012-10-07 NOTE — Progress Notes (Signed)
NOB interview. Exposed to dental Pt verbalizes comprehension.  substances at work. Advised to check MSDS.Pt advised to call if no improvement in N&V or unable to retain food or fluids x 24 h.

## 2012-10-09 LAB — CULTURE, OB URINE
Colony Count: NO GROWTH
Organism ID, Bacteria: NO GROWTH

## 2012-10-10 LAB — PRENATAL PANEL VII
Antibody Screen: NEGATIVE
Basophils Relative: 0 % (ref 0–1)
Eosinophils Absolute: 0.1 10*3/uL (ref 0.0–0.7)
Eosinophils Relative: 1 % (ref 0–5)
Hepatitis B Surface Ag: NEGATIVE
MCH: 30.2 pg (ref 26.0–34.0)
MCHC: 34.8 g/dL (ref 30.0–36.0)
MCV: 86.9 fL (ref 78.0–100.0)
Monocytes Relative: 7 % (ref 3–12)
Neutrophils Relative %: 54 % (ref 43–77)
Platelets: 178 10*3/uL (ref 150–400)

## 2012-10-10 LAB — TOXOPLASMA ANTIBODIES- IGG AND  IGM: Toxoplasma Antibody- IgM: <3 IU/mL (ref <8.0)

## 2012-10-12 NOTE — L&D Delivery Note (Signed)
Delivery Note Pt with increased vaginal/rectal pressure and C/C/+1 at 6:15 PM.  Pt pushed effectively and at 6:43 PM a viable female was delivered via Vaginal, Spontaneous Delivery (Presentation: Left Occiput Anterior). No nuchal cord noted.  No difficulty with shoulders.  Infant with spontaneous cry.  Infant placed on maternal abdomen, dried and stimulated.  Cord doubly clamped and cord cut by FOB.   APGAR: 8, 8; weight pending .   Placenta status: Intact, Spontaneous with lateral velamentous cord insertion noted.  Cord: 3 vessels with the following complications:None .  Cord pH: N/A  Anesthesia: Epidural  Episiotomy: None Lacerations: None Suture Repair: N/A Est. Blood Loss (mL): 450  Mom to postpartum.  Baby to nursery-stable.  Kazue Cerro O. 05/06/2013, 7:26 PM

## 2012-11-08 ENCOUNTER — Telehealth: Payer: Self-pay | Admitting: Obstetrics and Gynecology

## 2012-11-08 NOTE — Telephone Encounter (Signed)
TC to pt.  Per pt,request instructions left on VM.  Per VL, suggested Zyrtec now and then Benadryl when arrives home.   To call with no improvement.

## 2012-11-08 NOTE — Telephone Encounter (Signed)
TC to pt,  States since 9 AM has had itching on whole body, but more severe on legs.  Had same with last pregnancy and had liver issues.  Is unable to take Benadryl at this time because is at work.  Will consult with provider.

## 2012-11-14 ENCOUNTER — Ambulatory Visit: Payer: Medicaid Other | Admitting: Obstetrics and Gynecology

## 2012-11-14 ENCOUNTER — Encounter: Payer: Self-pay | Admitting: Obstetrics and Gynecology

## 2012-11-14 VITALS — BP 112/62 | Ht 59.0 in | Wt 118.0 lb

## 2012-11-14 DIAGNOSIS — Z369 Encounter for antenatal screening, unspecified: Secondary | ICD-10-CM

## 2012-11-14 DIAGNOSIS — Z331 Pregnant state, incidental: Secondary | ICD-10-CM

## 2012-11-14 DIAGNOSIS — L299 Pruritus, unspecified: Secondary | ICD-10-CM

## 2012-11-14 DIAGNOSIS — O09299 Supervision of pregnancy with other poor reproductive or obstetric history, unspecified trimester: Secondary | ICD-10-CM

## 2012-11-14 DIAGNOSIS — Z124 Encounter for screening for malignant neoplasm of cervix: Secondary | ICD-10-CM

## 2012-11-14 MED ORDER — CETIRIZINE HCL 5 MG PO TABS: 5.0000 mg | ORAL_TABLET | Freq: Every day | ORAL | Status: DC

## 2012-11-14 NOTE — Progress Notes (Signed)
[redacted]w[redacted]d Pt has been itching all over x 5-6 days

## 2012-11-14 NOTE — Progress Notes (Addendum)
[facility] NEW OB EXAMINATION   Kelsey Richards is a 32 y.o. female, [phone], who presents at [redacted]w[redacted]d gestation for a new obstetrical examination. The patient states that her last menstrual period was August 28, 2012.  She said that it was normal in every way.  Her due date is June 04, 2013 based on that.  She has not had an ultrasound done.  The patient delivered her first pregnancy at [redacted] weeks gestation.  Delivery was induced because of elevated liver enzymes. She was treated with magnesium. The patient had gestational diabetes requiring insulin during the first pregnancy.Her second pregnancy went to full term and were no complications.  The patient complains of itching.  Benadryl makes her sleepy.  The patient has had a LEEP procedure of her cervix because of an abnormal Pap smear.  The following portions of the patient's history were reviewed and updated as appropriate: allergies, current medications, past family history, past medical history, past social history, past surgical history and problem list.  OB History    Grav Para Term Preterm Abortions TAB SAB Ect Mult Living   7 2 1 1 4  3   2      Obstetric Comments   2009 GDM ON INSULIN; INCREASED LFT'S; MGSO4      Past Medical History  Diagnosis Date  . Abnormal Pap smear 2001    LEEP; LAST PAP ?  Marland Kitchen GERD (gastroesophageal reflux disease)     DURING PREGNANCY  . Preterm labor 200 9  . Infection     UTI OCC  . Gestational diabetes 2009    Past Surgical History  Procedure Date  . Leep 2001  . Wisdom tooth extraction 2008  . Induced abortion   . Dilation and curettage of uterus     Family History  Problem Relation Age of Onset  . Cancer Mother     MELANOMA  . Cancer Father     THROAT  . Alcohol abuse Father   . Seizures Son   . Cancer Maternal Aunt     SKIN  . Arthritis Maternal Grandmother   . Diabetes Paternal Grandmother   . Cancer Maternal Aunt     LUNG  . Cerebral palsy Cousin     Social History:  reports  that she quit smoking about 6 weeks ago. Her smoking use included Cigarettes. She smoked .5 packs per day. She has never used smokeless tobacco. She reports that she does not drink alcohol or use illicit drugs.  Allergies: No Known Allergies  Medications: multivitamins and Benadryl.   Objective:    BP 112/62  Ht 4\' 11"  (1.499 m)  Wt 118 lb (53.524 kg)  BMI 23.83 kg/m2  LMP 08/28/2012    Weight:  Wt Readings from Last 1 Encounters:  11/14/12 118 lb (53.524 kg)          BMI: Body mass index is 23.83 kg/(m^2).  General Appearance: Alert, appropriate appearance for age. No acute distress HEENT: Grossly normal Neck / Thyroid: Supple, no masses, nodes or enlargement Lungs: clear to auscultation bilaterally Back: No CVA tenderness Breast Exam: No masses or nodes.No dimpling, nipple retraction or discharge. Cardiovascular: Regular rate and rhythm. S1, S2, no murmur Gastrointestinal: Soft, non-tender, no masses or organomegaly.                               Fundal height: 11 weeks  Fetal heart tones audible: yes, 160 bpm  ++++++++++++++++++++++++++++++++++++++++++++++++++++++++  Pelvic Exam: External genitalia: normal general appearance Vaginal: normal without tenderness, induration or masses and relaxation: Yes Cervix: normal appearance Adnexa: normal bimanual exam Uterus: gravid, nontender, 12 weeks size  ++++++++++++++++++++++++++++++++++++++++++++++++++++++++  Lymphatic Exam: Non-palpable nodes in neck, clavicular, axillary, or inguinal regions Neurologic: Normal speech, no tremor  Psychiatric: Alert and oriented, appropriate affect.  Prenatal labs: ABO, Rh: A/POS/-- (12/27 1425) Antibody: NEG (12/27 1425) Rubella:  immune RPR: NON REAC (12/27 1425)  HBsAg: NEGATIVE (12/27 1425)  HIV: NON REACTIVE (12/27 1425)  GBS:   pending until the third trimester Hemoglobin 15.0 Platelet count 170,000 Toxoplasmosis: Nonimmune Urine culture:  Negative  Wet Prep:   Previously done:            no                     If no: Whiff:                     Negative                              Clue cells:             no                              PH:                        4.5                              Yeast:                    no                              Trichomoniasis:    no  Urine analysis:     N/A    Assessment:   32 y.o. female [id number] at [redacted]w[redacted]d gestation ( EDC is June 04, 2013) by: Normal Last menstrual period: yes  History of gestational diabetes that required insulin  History of elevated liver enzymes during the first pregnancy that required induction of delivery at [redacted] weeks gestation.  Suspected cholestasis of pregnancy. Not preterm labor.                            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  We discussed routine pregnancy issues:  Toxoplasmosis was reviewed.  The patient was told to avoid cat liter boxes and feces.  The patient was told to avoid predator fish including tuna because of our concerns for mercury consumption.  The patient was told to avoid soft cheeses.  The patient was told to be sure that all lunch meats are well cooked.  Genetic screening was discussed. First trimester screening in one week.  Our model for pregnancy management was reviewed.  Proper diet and exercise reviewed.  Return to office in 1 weeks.  Medications include:  Prenatal vitamins Cetirizine 5 mg every 24 hours as needed for itching  Early Glucola screening because of a history of gestational diabetes requiring insulin.  ABC's of pregnancy  given.  Mylinda Latina.D.

## 2012-11-14 NOTE — Patient Instructions (Signed)
ABCs of Pregnancy  A  Antepartum care is very important. Be sure you see your doctor and get prenatal care as soon as you think you are pregnant. At this time, you will be tested for infection, genetic abnormalities and potential problems with you and the pregnancy. This is the time to discuss diet, exercise, work, medications, labor, pain medication during labor and the possibility of a cesarean delivery. Ask any questions that may concern you. It is important to see your doctor regularly throughout your pregnancy. Avoid exposure to toxic substances and chemicals - such as cleaning solvents, lead and mercury, some insecticides, and paint. Pregnant women should avoid exposure to paint fumes, and fumes that cause you to feel ill, dizzy or faint. When possible, it is a good idea to have a pre-pregnancy consultation with your caregiver to begin some important recommendations your caregiver suggests such as, taking folic acid, exercising, quitting smoking, avoiding alcoholic beverages, etc.  B  Breastfeeding is the healthiest choice for both you and your baby. It has many nutritional benefits for the baby and health benefits for the mother. It also creates a very tight and loving bond between the baby and mother. Talk to your doctor, your family and friends, and your employer about how you choose to feed your baby and how they can support you in your decision. Not all birth defects can be prevented, but a woman can take actions that may increase her chance of having a healthy baby. Many birth defects happen very early in pregnancy, sometimes before a woman even knows she is pregnant. Birth defects or abnormalities of any child in your or the father's family should be discussed with your caregiver. Get a good support bra as your breast size changes. Wear it especially when you exercise and when nursing.   C  Celebrate the news of your pregnancy with the your spouse/father and family. Childbirth classes are helpful to  take for you and the spouse/father because it helps to understand what happens during the pregnancy, labor and delivery. Cesarean delivery should be discussed with your doctor so you are prepared for that possibility. The pros and cons of circumcision if it is a boy, should be discussed with your pediatrician. Cigarette smoking during pregnancy can result in low birth weight babies. It has been associated with infertility, miscarriages, tubal pregnancies, infant death (mortality) and poor health (morbidity) in childhood. Additionally, cigarette smoking may cause long-term learning disabilities. If you smoke, you should try to quit before getting pregnant and not smoke during the pregnancy. Secondary smoke may also harm a mother and her developing baby. It is a good idea to ask people to stop smoking around you during your pregnancy and after the baby is born. Extra calcium is necessary when you are pregnant and is found in your prenatal vitamin, in dairy products, green leafy vegetables and in calcium supplements.  D  A healthy diet according to your current weight and height, along with vitamins and mineral supplements should be discussed with your caregiver. Domestic abuse or violence should be made known to your doctor right away to get the situation corrected. Drink more water when you exercise to keep hydrated. Discomfort of your back and legs usually develops and progresses from the middle of the second trimester through to delivery of the baby. This is because of the enlarging baby and uterus, which may also affect your balance. Do not take illegal drugs. Illegal drugs can seriously harm the baby and you. Drink extra   fluids (water is best) throughout pregnancy to help your body keep up with the increases in your blood volume. Drink at least 6 to 8 glasses of water, fruit juice, or milk each day. A good way to know you are drinking enough fluid is when your urine looks almost like clear water or is very light  yellow.   E  Eat healthy to get the nutrients you and your unborn baby need. Your meals should include the five basic food groups. Exercise (30 minutes of light to moderate exercise a day) is important and encouraged during pregnancy, if there are no medical problems or problems with the pregnancy. Exercise that causes discomfort or dizziness should be stopped and reported to your caregiver. Emotions during pregnancy can change from being ecstatic to depression and should be understood by you, your partner and your family.  F  Fetal screening with ultrasound, amniocentesis and monitoring during pregnancy and labor is common and sometimes necessary. Take 400 micrograms of folic acid daily both before, when possible, and during the first few months of pregnancy to reduce the risk of birth defects of the brain and spine. All women who could possibly become pregnant should take a vitamin with folic acid, every day. It is also important to eat a healthy diet with fortified foods (enriched grain products, including cereals, rice, breads, and pastas) and foods with natural sources of folate (orange juice, green leafy vegetables, beans, peanuts, broccoli, asparagus, peas, and lentils). The father should be involved with all aspects of the pregnancy including, the prenatal care, childbirth classes, labor, delivery, and postpartum time. Fathers may also have emotional concerns about being a father, financial needs, and raising a family.  G  Genetic testing should be done appropriately. It is important to know your family and the father's history. If there have been problems with pregnancies or birth defects in your family, report these to your doctor. Also, genetic counselors can talk with you about the information you might need in making decisions about having a family. You can call a major medical center in your area for help in finding a board-certified genetic counselor. Genetic testing and counseling should be done  before pregnancy when possible, especially if there is a history of problems in the mother's or father's family. Certain ethnic backgrounds are more at risk for genetic defects.  H  Get familiar with the hospital where you will be having your baby. Get to know how long it takes to get there, the labor and delivery area, and the hospital procedures. Be sure your medical insurance is accepted there. Get your home ready for the baby including, clothes, the baby's room (when possible), furniture and car seat. Hand washing is important throughout the day, especially after handling raw meat and poultry, changing the baby's diaper or using the bathroom. This can help prevent the spread of many bacteria and viruses that cause infection. Your hair may become dry and thinner, but will return to normal a few weeks after the baby is born. Heartburn is a common problem that can be treated by taking antacids recommended by your caregiver, eating smaller meals 5 or 6 times a day, not drinking liquids when eating, drinking between meals and raising the head of your bed 2 to 3 inches.  I  Insurance to cover you, the baby, doctor and hospital should be reviewed so that you will be prepared to pay any costs not covered by your insurance plan. If you do not have medical insurance,   there are usually clinics and services available for you in your community. Take 30 milligrams of iron during your pregnancy as prescribed by your doctor to reduce the risk of low red blood cells (anemia) later in pregnancy. All women of childbearing age should eat a diet rich in iron.  J  There should be a joint effort for the mother, father and any other children to adapt to the pregnancy financially, emotionally, and psychologically during the pregnancy. Join a support group for moms-to-be. Or, join a class on parenting or childbirth. Have the family participate when possible.  K  Know your limits. Let your caregiver know if you experience any of the  following:   · Pain of any kind.  · Strong cramps.  · You develop a lot of weight in a short period of time (5 pounds in 3 to 5 days).  · Vaginal bleeding, leaking of amniotic fluid.  · Headache, vision problems.  · Dizziness, fainting, shortness of breath.  · Chest pain.  · Fever of 102° F (38.9° C) or higher.  · Gush of clear fluid from your vagina.  · Painful urination.  · Domestic violence.  · Irregular heartbeat (palpitations).  · Rapid beating of the heart (tachycardia).  · Constant feeling sick to your stomach (nauseous) and vomiting.  · Trouble walking, fluid retention (edema).  · Muscle weakness.  · If your baby has decreased activity.  · Persistent diarrhea.  · Abnormal vaginal discharge.  · Uterine contractions at 20-minute intervals.  · Back pain that travels down your leg.  L  Learn and practice that what you eat and drink should be in moderation and healthy for you and your baby. Legal drugs such as alcohol and caffeine are important issues for pregnant women. There is no safe amount of alcohol a woman can drink while pregnant. Fetal alcohol syndrome, a disorder characterized by growth retardation, facial abnormalities, and central nervous system dysfunction, is caused by a woman's use of alcohol during pregnancy. Caffeine, found in tea, coffee, soft drinks and chocolate, should also be limited. Be sure to read labels when trying to cut down on caffeine during pregnancy. More than 200 foods, beverages, and over-the-counter medications contain caffeine and have a high salt content! There are coffees and teas that do not contain caffeine.  M  Medical conditions such as diabetes, epilepsy, and high blood pressure should be treated and kept under control before pregnancy when possible, but especially during pregnancy. Ask your caregiver about any medications that may need to be changed or adjusted during pregnancy. If you are currently taking any medications, ask your caregiver if it is safe to take them  while you are pregnant or before getting pregnant when possible. Also, be sure to discuss any herbs or vitamins you are taking. They are medicines, too! Discuss with your doctor all medications, prescribed and over-the-counter, that you are taking. During your prenatal visit, discuss the medications your doctor may give you during labor and delivery.  N  Never be afraid to ask your doctor or caregiver questions about your health, the progress of the pregnancy, family problems, stressful situations, and recommendation for a pediatrician, if you do not have one. It is better to take all precautions and discuss any questions or concerns you may have during your office visits. It is a good idea to write down your questions before you visit the doctor.  O  Over-the-counter cough and cold remedies may contain alcohol or other ingredients that should   be avoided during pregnancy. Ask your caregiver about prescription, herbs or over-the-counter medications that you are taking or may consider taking while pregnant.   P  Physical activity during pregnancy can benefit both you and your baby by lessening discomfort and fatigue, providing a sense of well-being, and increasing the likelihood of early recovery after delivery. Light to moderate exercise during pregnancy strengthens the belly (abdominal) and back muscles. This helps improve posture. Practicing yoga, walking, swimming, and cycling on a stationary bicycle are usually safe exercises for pregnant women. Avoid scuba diving, exercise at high altitudes (over 3000 feet), skiing, horseback riding, contact sports, etc. Always check with your doctor before beginning any kind of exercise, especially during pregnancy and especially if you did not exercise before getting pregnant.  Q  Queasiness, stomach upset and morning sickness are common during pregnancy. Eating a couple of crackers or dry toast before getting out of bed. Foods that you normally love may make you feel sick to  your stomach. You may need to substitute other nutritious foods. Eating 5 or 6 small meals a day instead of 3 large ones may make you feel better. Do not drink with your meals, drink between meals. Questions that you have should be written down and asked during your prenatal visits.  R  Read about and make plans to baby-proof your home. There are important tips for making your home a safer environment for your baby. Review the tips and make your home safer for you and your baby. Read food labels regarding calories, salt and fat content in the food.  S  Saunas, hot tubs, and steam rooms should be avoided while you are pregnant. Excessive high heat may be harmful during your pregnancy. Your caregiver will screen and examine you for sexually transmitted diseases and genetic disorders during your prenatal visits. Learn the signs of labor. Sexual relations while pregnant is safe unless there is a medical or pregnancy problem and your caregiver advises against it.  T  Traveling long distances should be avoided especially in the third trimester of your pregnancy. If you do have to travel out of state, be sure to take a copy of your medical records and medical insurance plan with you. You should not travel long distances without seeing your doctor first. Most airlines will not allow you to travel after 36 weeks of pregnancy. Toxoplasmosis is an infection caused by a parasite that can seriously harm an unborn baby. Avoid eating undercooked meat and handling cat litter. Be sure to wear gloves when gardening. Tingling of the hands and fingers is not unusual and is due to fluid retention. This will go away after the baby is born.  U  Womb (uterus) size increases during the first trimester. Your kidneys will begin to function more efficiently. This may cause you to feel the need to urinate more often. You may also leak urine when sneezing, coughing or laughing. This is due to the growing uterus pressing against your bladder,  which lies directly in front of and slightly under the uterus during the first few months of pregnancy. If you experience burning along with frequency of urination or bloody urine, be sure to tell your doctor. The size of your uterus in the third trimester may cause a problem with your balance. It is advisable to maintain good posture and avoid wearing high heels during this time. An ultrasound of your baby may be necessary during your pregnancy and is safe for you and your baby.  V    Vaccinations are an important concern for pregnant women. Get needed vaccines before pregnancy. Center for Disease Control (www.cdc.gov) has clear guidelines for the use of vaccines during pregnancy. Review the list, be sure to discuss it with your doctor. Prenatal vitamins are helpful and healthy for you and the baby. Do not take extra vitamins except what is recommended. Taking too much of certain vitamins can cause overdose problems. Continuous vomiting should be reported to your caregiver. Varicose veins may appear especially if there is a family history of varicose veins. They should subside after the delivery of the baby. Support hose helps if there is leg discomfort.  W  Being overweight or underweight during pregnancy may cause problems. Try to get within 15 pounds of your ideal weight before pregnancy. Remember, pregnancy is not a time to be dieting! Do not stop eating or start skipping meals as your weight increases. Both you and your baby need the calories and nutrition you receive from a healthy diet. Be sure to consult with your doctor about your diet. There is a formula and diet plan available depending on whether you are overweight or underweight. Your caregiver or nutritionist can help and advise you if necessary.  X  Avoid X-rays. If you must have dental work or diagnostic tests, tell your dentist or physician that you are pregnant so that extra care can be taken. X-rays should only be taken when the risks of not taking  them outweigh the risk of taking them. If needed, only the minimum amount of radiation should be used. When X-rays are necessary, protective lead shields should be used to cover areas of the body that are not being X-rayed.  Y  Your baby loves you. Breastfeeding your baby creates a loving and very close bond between the two of you. Give your baby a healthy environment to live in while you are pregnant. Infants and children require constant care and guidance. Their health and safety should be carefully watched at all times. After the baby is born, rest or take a nap when the baby is sleeping.  Z  Get your ZZZs. Be sure to get plenty of rest. Resting on your side as often as possible, especially on your left side is advised. It provides the best circulation to your baby and helps reduce swelling. Try taking a nap for 30 to 45 minutes in the afternoon when possible. After the baby is born rest or take a nap when the baby is sleeping. Try elevating your feet for that amount of time when possible. It helps the circulation in your legs and helps reduce swelling.   Most information courtesy of the CDC.  Document Released: 09/28/2005 Document Revised: 12/21/2011 Document Reviewed: 06/12/2009  [employer]® Patient Information ©2013 [employer].

## 2012-11-15 ENCOUNTER — Encounter: Payer: Medicaid Other | Admitting: Obstetrics and Gynecology

## 2012-11-15 ENCOUNTER — Other Ambulatory Visit: Payer: Medicaid Other

## 2012-11-15 LAB — PAP IG, CT-NG, RFX HPV ASCU
Chlamydia Probe Amp: NEGATIVE
GC Probe Amp: NEGATIVE

## 2012-11-26 ENCOUNTER — Other Ambulatory Visit: Payer: Self-pay

## 2012-11-28 ENCOUNTER — Encounter: Payer: Self-pay | Admitting: Obstetrics and Gynecology

## 2012-11-28 ENCOUNTER — Ambulatory Visit: Payer: Medicaid Other | Admitting: Obstetrics and Gynecology

## 2012-11-28 ENCOUNTER — Telehealth: Payer: Self-pay | Admitting: Obstetrics and Gynecology

## 2012-11-28 VITALS — BP 106/60 | Wt 121.0 lb

## 2012-11-28 DIAGNOSIS — Z3490 Encounter for supervision of normal pregnancy, unspecified, unspecified trimester: Secondary | ICD-10-CM

## 2012-11-28 DIAGNOSIS — N390 Urinary tract infection, site not specified: Secondary | ICD-10-CM

## 2012-11-28 DIAGNOSIS — R109 Unspecified abdominal pain: Secondary | ICD-10-CM

## 2012-11-28 DIAGNOSIS — Z3689 Encounter for other specified antenatal screening: Secondary | ICD-10-CM

## 2012-11-28 DIAGNOSIS — O09299 Supervision of pregnancy with other poor reproductive or obstetric history, unspecified trimester: Secondary | ICD-10-CM

## 2012-11-28 DIAGNOSIS — R1031 Right lower quadrant pain: Secondary | ICD-10-CM

## 2012-11-28 NOTE — Progress Notes (Signed)
[redacted]w[redacted]d Pt states that she fell this morning around 7:40a.m. States that she landed on her bottom and back. States that she began cramping 11:00 a.m. No vaginal bleeding, no discharge. Wants 1st trimester screen.  Will schedule this week ANATOMY U/S AND afp at 18wks
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
## 2012-11-29 ENCOUNTER — Other Ambulatory Visit: Payer: Self-pay | Admitting: Obstetrics and Gynecology

## 2012-11-29 ENCOUNTER — Ambulatory Visit: Payer: Medicaid Other

## 2012-11-29 ENCOUNTER — Encounter: Payer: Self-pay | Admitting: Obstetrics and Gynecology

## 2012-11-29 ENCOUNTER — Ambulatory Visit: Payer: Medicaid Other | Admitting: Obstetrics and Gynecology

## 2012-11-29 VITALS — BP 98/50 | Wt 121.0 lb

## 2012-11-29 DIAGNOSIS — Z331 Pregnant state, incidental: Secondary | ICD-10-CM

## 2012-11-29 DIAGNOSIS — Z369 Encounter for antenatal screening, unspecified: Secondary | ICD-10-CM

## 2012-11-29 DIAGNOSIS — Z3689 Encounter for other specified antenatal screening: Secondary | ICD-10-CM

## 2012-11-29 LAB — POCT URINALYSIS DIPSTICK
Nitrite, UA: NEGATIVE
Urobilinogen, UA: NEGATIVE
pH, UA: 6

## 2012-11-29 NOTE — Progress Notes (Addendum)
[redacted]w[redacted]d The patient states that her last menstrual period was normal.  Her gestational age would be 13 weeks and 2 days based on that.  She presented for a first trimester screen today.  However, ultrasound today shows a 16 week and 4 day gestation.  Cervix 3.89 cm.  Single viable intrauterine gestation. I believe that the most accurate gestational age is 16 weeks and 4 days.  Based on that, her due date is May 12, 2013. Repeat ultrasound in 2 weeks to better evaluate gestational age and also to perform an anatomy scan. Quad screen today. Gonorrhea and Chlamydia negative from last visit. Early Glucola next visit because of a history of gestational diabetes. Dr. Stefano Gaul

## 2012-11-29 NOTE — Progress Notes (Signed)
[redacted]w[redacted]d Pt has no complaints  U/S done today

## 2012-11-29 NOTE — Addendum Note (Signed)
Addended by: Lerry Liner D on: 11/29/2012 03:58 PM   Modules accepted: Orders

## 2012-12-02 LAB — AFP, QUAD SCREEN
AFP: 45.2 IU/mL
Age Alone: 1:493 {titer}
Curr Gest Age: 16.4 wks.days
Down Syndrome Scr Risk Est: 1:35000 {titer}
HCG, Total: 13943 m[IU]/mL
MoM for AFP: 1.24
MoM for INH: 0.99
Open Spina bifida: NEGATIVE
Trisomy 18 (Edward) Syndrome Interp.: 1:790 {titer}

## 2012-12-02 LAB — US OB COMP LESS 14 WKS

## 2012-12-12 ENCOUNTER — Other Ambulatory Visit: Payer: Medicaid Other

## 2012-12-12 ENCOUNTER — Encounter: Payer: Medicaid Other | Admitting: Obstetrics and Gynecology

## 2012-12-15 ENCOUNTER — Other Ambulatory Visit: Payer: Self-pay

## 2012-12-19 ENCOUNTER — Other Ambulatory Visit: Payer: Self-pay

## 2012-12-19 DIAGNOSIS — Z3689 Encounter for other specified antenatal screening: Secondary | ICD-10-CM

## 2012-12-21 ENCOUNTER — Ambulatory Visit: Payer: Medicaid Other | Admitting: Obstetrics and Gynecology

## 2012-12-21 ENCOUNTER — Encounter: Payer: Self-pay | Admitting: Obstetrics and Gynecology

## 2012-12-21 ENCOUNTER — Ambulatory Visit: Payer: Medicaid Other

## 2012-12-21 VITALS — BP 102/62 | Wt 120.0 lb

## 2012-12-21 DIAGNOSIS — O3503X0 Maternal care for (suspected) central nervous system malformation or damage in fetus, choroid plexus cysts, not applicable or unspecified: Secondary | ICD-10-CM

## 2012-12-21 DIAGNOSIS — O43129 Velamentous insertion of umbilical cord, unspecified trimester: Secondary | ICD-10-CM | POA: Insufficient documentation

## 2012-12-21 DIAGNOSIS — O43122 Velamentous insertion of umbilical cord, second trimester: Secondary | ICD-10-CM

## 2012-12-21 DIAGNOSIS — G930 Cerebral cysts: Secondary | ICD-10-CM

## 2012-12-21 DIAGNOSIS — Z3689 Encounter for other specified antenatal screening: Secondary | ICD-10-CM

## 2012-12-21 DIAGNOSIS — O24419 Gestational diabetes mellitus in pregnancy, unspecified control: Secondary | ICD-10-CM

## 2012-12-21 DIAGNOSIS — O350XX0 Maternal care for (suspected) central nervous system malformation in fetus, not applicable or unspecified: Secondary | ICD-10-CM

## 2012-12-21 LAB — CBC
MCH: 29.9 pg (ref 26.0–34.0)
MCHC: 34 g/dL (ref 30.0–36.0)
Platelets: 168 10*3/uL (ref 150–400)
RDW: 14.3 % (ref 11.5–15.5)

## 2012-12-21 LAB — US OB COMP + 14 WK

## 2012-12-21 NOTE — Progress Notes (Signed)
[redacted]w[redacted]d Anatomy U/S today

## 2012-12-21 NOTE — Progress Notes (Signed)
[redacted]w[redacted]d Early Glucola given per AVS Ultrasound shows:  SIUP  S=D     [place] EDD: 05/12/2013           EFW: 10 oz 33 %           AFI: n/a           Cervical length: not measured           Placenta localization: anterior           Fetal presentation: breech                    Anatomy survey is normal           Gender : female Comments: Placental edge is 5.0 cm from Internal OS - normal  Bilateral choroid plexus cyst are see (right choroid = 0.75 cm, left choroid = 0.62, suggest f/u in 4 weeks, normal ovaries, cervix closed The placental cord insertion appears to be velamentous in certain views but can not rule out marginal insertion. Suggest eval at next u/s. EDD by today's u/s is consistent with first u/s dating

## 2012-12-21 NOTE — Progress Notes (Signed)
[redacted]w[redacted]d Early Glucola today Ultrasound findings reviewed: will repeat at 28 weeks

## 2012-12-22 LAB — GLUCOSE TOLERANCE, 1 HOUR (50G) W/O FASTING: Glucose, 1 Hour GTT: 99 mg/dL (ref 70–140)

## 2013-01-20 ENCOUNTER — Encounter (HOSPITAL_COMMUNITY): Payer: Self-pay | Admitting: *Deleted

## 2013-01-20 ENCOUNTER — Inpatient Hospital Stay (HOSPITAL_COMMUNITY)
Admission: EM | Admit: 2013-01-20 | Discharge: 2013-01-21 | Disposition: A | Discharge status: Home / Self Care | Payer: No Typology Code available for payment source | Attending: Obstetrics and Gynecology | Admitting: Obstetrics and Gynecology

## 2013-01-20 DIAGNOSIS — O350XX1 Maternal care for (suspected) central nervous system malformation in fetus, fetus 1: Secondary | ICD-10-CM

## 2013-01-20 DIAGNOSIS — O43122 Velamentous insertion of umbilical cord, second trimester: Secondary | ICD-10-CM

## 2013-01-20 DIAGNOSIS — Y92410 Unspecified street and highway as the place of occurrence of the external cause: Secondary | ICD-10-CM | POA: Insufficient documentation

## 2013-01-20 DIAGNOSIS — R109 Unspecified abdominal pain: Secondary | ICD-10-CM | POA: Insufficient documentation

## 2013-01-20 DIAGNOSIS — O368190 Decreased fetal movements, unspecified trimester, not applicable or unspecified: Secondary | ICD-10-CM | POA: Insufficient documentation

## 2013-01-20 DIAGNOSIS — O99891 Other specified diseases and conditions complicating pregnancy: Secondary | ICD-10-CM | POA: Insufficient documentation

## 2013-01-20 LAB — CBC
Hemoglobin: 12.8 g/dL (ref 12.0–15.0)
MCH: 31.5 pg (ref 26.0–34.0)
MCHC: 36.1 g/dL — ABNORMAL HIGH (ref 30.0–36.0)
Platelets: 174 10*3/uL (ref 150–400)
RBC: 4.06 MIL/uL (ref 3.87–5.11)

## 2013-01-20 LAB — COMPREHENSIVE METABOLIC PANEL
ALT: 14 U/L (ref 0–35)
AST: 23 U/L (ref 0–37)
Alkaline Phosphatase: 101 U/L (ref 39–117)
CO2: 24 mEq/L (ref 19–32)
Calcium: 8.7 mg/dL (ref 8.4–10.5)
Glucose, Bld: 101 mg/dL — ABNORMAL HIGH (ref 70–99)
Potassium: 3.2 mEq/L — ABNORMAL LOW (ref 3.5–5.1)
Sodium: 134 mEq/L — ABNORMAL LOW (ref 135–145)
Total Protein: 6.6 g/dL (ref 6.0–8.3)

## 2013-01-20 MED ORDER — ACETAMINOPHEN 325 MG PO TABS
650.0000 mg | ORAL_TABLET | Freq: Once | ORAL | Status: AC
  Administered 2013-01-20: 650 mg via ORAL
  Filled 2013-01-20: qty 2

## 2013-01-20 MED ORDER — SODIUM CHLORIDE 0.9 % IV BOLUS (SEPSIS)
1000.0000 mL | Freq: Once | INTRAVENOUS | Status: AC
  Administered 2013-01-20: 1000 mL via INTRAVENOUS
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
## 2013-01-20 NOTE — ED Notes (Signed)
Verne Grain and Dorene Grebe Deal from rapid response.

## 2013-01-21 ENCOUNTER — Encounter (HOSPITAL_COMMUNITY): Payer: Self-pay | Admitting: *Deleted

## 2013-01-21 ENCOUNTER — Emergency Department (HOSPITAL_COMMUNITY)
Admission: EM | Admit: 2013-01-21 | Discharge: 2013-01-21 | Disposition: A | Discharge status: Home / Self Care | Payer: No Typology Code available for payment source | Attending: Emergency Medicine | Admitting: Emergency Medicine

## 2013-01-21 ENCOUNTER — Encounter (HOSPITAL_COMMUNITY): Payer: Self-pay | Admitting: Emergency Medicine

## 2013-01-21 DIAGNOSIS — Z8719 Personal history of other diseases of the digestive system: Secondary | ICD-10-CM | POA: Insufficient documentation

## 2013-01-21 DIAGNOSIS — O9989 Other specified diseases and conditions complicating pregnancy, childbirth and the puerperium: Secondary | ICD-10-CM | POA: Insufficient documentation

## 2013-01-21 DIAGNOSIS — M542 Cervicalgia: Secondary | ICD-10-CM

## 2013-01-21 DIAGNOSIS — Z8751 Personal history of pre-term labor: Secondary | ICD-10-CM | POA: Insufficient documentation

## 2013-01-21 DIAGNOSIS — Z79899 Other long term (current) drug therapy: Secondary | ICD-10-CM | POA: Insufficient documentation

## 2013-01-21 DIAGNOSIS — Y92410 Unspecified street and highway as the place of occurrence of the external cause: Secondary | ICD-10-CM | POA: Insufficient documentation

## 2013-01-21 DIAGNOSIS — O99330 Smoking (tobacco) complicating pregnancy, unspecified trimester: Secondary | ICD-10-CM | POA: Insufficient documentation

## 2013-01-21 DIAGNOSIS — S0993XA Unspecified injury of face, initial encounter: Secondary | ICD-10-CM | POA: Insufficient documentation

## 2013-01-21 DIAGNOSIS — S199XXA Unspecified injury of neck, initial encounter: Secondary | ICD-10-CM | POA: Insufficient documentation

## 2013-01-21 DIAGNOSIS — Z87440 Personal history of urinary (tract) infections: Secondary | ICD-10-CM | POA: Insufficient documentation

## 2013-01-21 DIAGNOSIS — Y9389 Activity, other specified: Secondary | ICD-10-CM | POA: Insufficient documentation

## 2013-01-21 DIAGNOSIS — Z3490 Encounter for supervision of normal pregnancy, unspecified, unspecified trimester: Secondary | ICD-10-CM

## 2013-01-21 LAB — URINALYSIS, ROUTINE W REFLEX MICROSCOPIC
Bilirubin Urine: NEGATIVE
Hgb urine dipstick: NEGATIVE
Ketones, ur: NEGATIVE mg/dL
Specific Gravity, Urine: 1.008 (ref 1.005–1.030)
pH: 7 (ref 5.0–8.0)

## 2013-01-21 LAB — URINE MICROSCOPIC-ADD ON

## 2013-01-21 MED ORDER — HYDROCODONE-ACETAMINOPHEN 5-325 MG PO TABS: ORAL_TABLET | ORAL | Status: DC
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
## 2013-01-22 LAB — URINE CULTURE

## 2013-01-22 NOTE — ED Provider Notes (Signed)
Evaluation and management procedures were performed by the PA/NP/CNM under my supervision/collaboration.   Chrystine Oiler, MD 01/22/13 [phone]

## 2013-03-29 ENCOUNTER — Inpatient Hospital Stay (HOSPITAL_COMMUNITY)
Admission: AD | Admit: 2013-03-29 | Discharge: 2013-03-29 | Disposition: A | Discharge status: Home / Self Care | Payer: Medicaid Other | Source: Ambulatory Visit | Attending: Obstetrics and Gynecology | Admitting: Obstetrics and Gynecology

## 2013-03-29 ENCOUNTER — Encounter (HOSPITAL_COMMUNITY): Payer: Self-pay | Admitting: *Deleted

## 2013-03-29 DIAGNOSIS — O99891 Other specified diseases and conditions complicating pregnancy: Secondary | ICD-10-CM | POA: Insufficient documentation

## 2013-03-29 DIAGNOSIS — Y92009 Unspecified place in unspecified non-institutional (private) residence as the place of occurrence of the external cause: Secondary | ICD-10-CM | POA: Insufficient documentation

## 2013-03-29 DIAGNOSIS — M5430 Sciatica, unspecified side: Secondary | ICD-10-CM

## 2013-03-29 DIAGNOSIS — R109 Unspecified abdominal pain: Secondary | ICD-10-CM | POA: Insufficient documentation

## 2013-03-29 DIAGNOSIS — W010XXA Fall on same level from slipping, tripping and stumbling without subsequent striking against object, initial encounter: Secondary | ICD-10-CM | POA: Insufficient documentation

## 2013-03-29 LAB — CBC
HCT: 35 % — ABNORMAL LOW (ref 36.0–46.0)
Hemoglobin: 12 g/dL (ref 12.0–15.0)
MCH: 31.4 pg (ref 26.0–34.0)
MCHC: 34.3 g/dL (ref 30.0–36.0)
MCV: 91.6 fL (ref 78.0–100.0)

## 2013-03-29 NOTE — MAU Note (Signed)
Pt states lower pelvic pain that is constant noted for several weeks. Getting progressively worse. Denies bleeding or abnormal vaginal discharge.

## 2013-03-29 NOTE — MAU Provider Note (Signed)
History   32 yo, [id number] at [redacted]w[redacted]d presents to MAU for pain in upper leg/hip/groin area started about 3-4 wks, constant and seems to be getting worse. She reports that she felll this morning at 0700 and 0900 - "legs just gave out from under her."  She went to her knees and broke her fall with her hands.  Abdomen did not hit the floor.  Denies VB, UCs/cramping, LOF, recent fever, resp or GI c/o's,UTI or PIH s/s. GFM.   Chief Complaint  Patient presents with  . Pelvic Pain    OB History   Grav Para Term Preterm Abortions TAB SAB Ect Mult Living   7 2 1 1 4  3   2      Obstetric Comments   2009 GDM ON INSULIN; INCREASED LFT'S; MGSO4      Past Medical History  Diagnosis Date  . GERD (gastroesophageal reflux disease)     DURING PREGNANCY  . Preterm labor 200 9  . Gestational diabetes     with first child  . Infection     UTI OCC  . Abnormal Pap smear 2001    LEEP; LAST PAP ?    Past Surgical History  Procedure Laterality Date  . Leep  2001  . Wisdom tooth extraction  2008  . Induced abortion    . Dilation and curettage of uterus      Family History  Problem Relation Age of Onset  . Cancer Mother     MELANOMA  . Cancer Father     THROAT  . Alcohol abuse Father   . Seizures Son   . Cancer Maternal Aunt     SKIN  . Arthritis Maternal Grandmother   . Diabetes Paternal Grandmother   . Cancer Maternal Aunt     LUNG  . Cerebral palsy Cousin     History  Substance Use Topics  . Smoking status: Current Some Day Smoker -- 0.25 packs/day for 12 years    Types: Cigarettes  . Smokeless tobacco: Never Used  . Alcohol Use: No    Allergies: No Known Allergies  Prescriptions prior to admission  Medication Sig Dispense Refill  . calcium carbonate (TUMS - DOSED IN MG ELEMENTAL CALCIUM) 500 MG chewable tablet Chew 1 tablet by mouth daily.      . Prenatal Vit-Fe Fumarate-FA (MULTIVITAMIN-PRENATAL) 27-0.8 MG TABS Take 1 tablet by mouth daily.      . [DISCONTINUED]  HYDROcodone-acetaminophen (NORCO/VICODIN) 5-325 MG per tablet Take 1-2 tablets by mouth every 6 hours as needed for pain.  15 tablet  0    ROS: see HPI above, all other systems are negative   Physical Exam   Blood pressure 114/63, pulse 95, temperature 98.3 F (36.8 C), temperature source Oral, resp. rate 18, height 4\' 11"  (1.499 m), weight 131 lb (59.421 kg), last menstrual period 08/28/2012, not currently breastfeeding.  Chest: Clear Heart: RRR Abdomen: gravid, NT Extremities: WNL  Pelvic: Dilation: Closed Effacement (%): Thick Station: -2 Exam by:: j. Rahma Meller, cnm No VB  FHT: Reactive NST UCs: x2  Results for orders placed during the hospital encounter of 03/29/13 (from the past 24 hour(s))  CBC     Status: Abnormal   Collection Time    03/29/13  4:16 PM      Result Value Range   WBC 11.9 (*) 4.0 - 10.5 K/uL   RBC 3.82 (*) 3.87 - 5.11 MIL/uL   Hemoglobin 12.0  12.0 - 15.0 g/dL   HCT 57.8 (*) 46.9 -  46.0 %   MCV 91.6  78.0 - 100.0 fL   MCH 31.4  26.0 - 34.0 pg   MCHC 34.3  30.0 - 36.0 g/dL   RDW 02.7  25.3 - 66.4 %   Platelets 184  150 - 400 K/uL    ED Course  IUP at [redacted]w[redacted]d Round ligament pain and sciatica Fall at home  Discharge home with precautions Referral to PT - office notified to make arrangements F/u in office with scheduled appt on 6/23    Haroldine Laws CNM, MSN 03/29/2013 3:42 PM

## 2013-03-29 NOTE — MAU Note (Signed)
Pain in upper leg/jhip/groin area started about 3-4 wks, constant and seems to be getting worse.  felll this mornig- legs just gave out from under her.

## 2013-04-20 ENCOUNTER — Inpatient Hospital Stay (HOSPITAL_COMMUNITY)
Admission: AD | Admit: 2013-04-20 | Discharge: 2013-04-20 | Disposition: A | Discharge status: Home / Self Care | Payer: Medicaid Other | Source: Ambulatory Visit | Attending: Obstetrics and Gynecology | Admitting: Obstetrics and Gynecology

## 2013-04-20 ENCOUNTER — Encounter (HOSPITAL_COMMUNITY): Payer: Self-pay | Admitting: *Deleted

## 2013-04-20 DIAGNOSIS — O4703 False labor before 37 completed weeks of gestation, third trimester: Secondary | ICD-10-CM

## 2013-04-20 DIAGNOSIS — O4700 False labor before 37 completed weeks of gestation, unspecified trimester: Secondary | ICD-10-CM | POA: Insufficient documentation

## 2013-04-20 NOTE — MAU Provider Note (Signed)
History   32yo [id number] at [redacted]w[redacted]d presents to MAU with c/o "feeling like her insides are coming out of her" vagina.  Denies VB, UCs, LOF, resent IC, recent fever, resp or GI c/o's, UTI or PIH s/s. GFM. Reports staying hydrated.  Last BM yesterday.  Chief Complaint  Patient presents with  . Labor Eval    OB History   Grav Para Term Preterm Abortions TAB SAB Ect Mult Living   7 2 1 1 4  3   2      Obstetric Comments   2009 GDM ON INSULIN; INCREASED LFT'S; MGSO4      Past Medical History  Diagnosis Date  . GERD (gastroesophageal reflux disease)     DURING PREGNANCY  . Preterm labor 200 9  . Gestational diabetes     with first child  . Infection     UTI OCC  . Abnormal Pap smear 2001    LEEP; LAST PAP ?    Past Surgical History  Procedure Laterality Date  . Leep  2001  . Wisdom tooth extraction  2008  . Induced abortion    . Dilation and curettage of uterus      Family History  Problem Relation Age of Onset  . Cancer Mother     MELANOMA  . Cancer Father     THROAT  . Alcohol abuse Father   . Seizures Son   . Cancer Maternal Aunt     SKIN  . Arthritis Maternal Grandmother   . Diabetes Paternal Grandmother   . Cancer Maternal Aunt     LUNG  . Cerebral palsy Cousin     History  Substance Use Topics  . Smoking status: Current Some Day Smoker -- 0.25 packs/day for 12 years    Types: Cigarettes  . Smokeless tobacco: Never Used  . Alcohol Use: No    Allergies: No Known Allergies  Prescriptions prior to admission  Medication Sig Dispense Refill  . calcium carbonate (TUMS - DOSED IN MG ELEMENTAL CALCIUM) 500 MG chewable tablet Chew 1 tablet by mouth daily.      . Prenatal Vit-Fe Fumarate-FA (MULTIVITAMIN-PRENATAL) 27-0.8 MG TABS Take 1 tablet by mouth daily.        ROS: see HPI above, all other systems are negative   Physical Exam   Blood pressure 125/78, pulse 126, resp. rate 18, last menstrual period 08/28/2012.  Chest: Clear Heart: RRR Abdomen:  gravid, NT Extremities: WNL  Dilation: 1 Effacement (%): 40 Cervical Position: Posterior Station: -2 Presentation: Vertex Exam by:: J.Tenesha Garza,CNM  FHT: Reactive NST UCs: Irregular with uterine irritability  ED Course  IUP at [redacted]w[redacted]d Labor check  Assess for cervical change  Dilation: 1 Effacement (%): 40 Cervical Position: Posterior Station: -2 Presentation: Vertex Exam by:: J.Xandria Gallaga,CNM No cervical change  D/c home with precautions F/u at already scheduled ROB on 04/24/13   Haroldine Laws CNM, MSN 04/20/2013 9:54 AM

## 2013-04-20 NOTE — MAU Note (Signed)
Pt reports ctx started this morning. Denies SROm or bleeding. Good fetal movement felt.

## 2013-05-02 ENCOUNTER — Encounter (HOSPITAL_COMMUNITY): Payer: Self-pay

## 2013-05-02 ENCOUNTER — Inpatient Hospital Stay (HOSPITAL_COMMUNITY)
Admission: AD | Admit: 2013-05-02 | Discharge: 2013-05-02 | DRG: 780 | Disposition: A | Discharge status: Home / Self Care | Payer: Medicaid Other | Source: Ambulatory Visit | Attending: Obstetrics and Gynecology | Admitting: Obstetrics and Gynecology

## 2013-05-02 DIAGNOSIS — O479 False labor, unspecified: Principal | ICD-10-CM | POA: Diagnosis present

## 2013-05-02 DIAGNOSIS — O471 False labor at or after 37 completed weeks of gestation: Secondary | ICD-10-CM

## 2013-05-02 HISTORY — DX: Urinary tract infection, site not specified: N39.0

## 2013-05-02 HISTORY — DX: Supervision of pregnancy with other poor reproductive or obstetric history, unspecified trimester: O09.299

## 2013-05-02 HISTORY — DX: Personal history of gestational diabetes: Z86.32

## 2013-05-02 NOTE — MAU Note (Signed)
Patient states she is having contractions every 10 minutes with bloody show. Was seen in the office yesterday and was 3cm and membranes stripped. Denies leaking and reports good fetal movement.

## 2013-05-02 NOTE — MAU Provider Note (Signed)
History   31yo, [id number] at [redacted]w[redacted]d presents with UCs Q 10 min.  Pt reports her membranes were swept yesterday.  Pt is unsure if she has LOF.  Denies VB, recent fever, resp or GI c/o's, UTI or PIH s/s. GFM.   Chief Complaint  Patient presents with  . Labor Eval    OB History   Grav Para Term Preterm Abortions TAB SAB Ect Mult Living   7 2 1 1 4  3   2      Obstetric Comments   2009 GDM ON INSULIN; INCREASED LFT'S; MGSO4      Past Medical History  Diagnosis Date  . GERD (gastroesophageal reflux disease)     DURING PREGNANCY  . Preterm labor 200 9  . Infection     UTI OCC  . Abnormal Pap smear 2001    LEEP; LAST PAP ?  Marland Kitchen H/O gestational diabetes in prior pregnancy, currently pregnant   . Urinary tract infection     Past Surgical History  Procedure Laterality Date  . Leep  2001  . Wisdom tooth extraction  2008  . Induced abortion    . Dilation and curettage of uterus      Family History  Problem Relation Age of Onset  . Cancer Mother     MELANOMA  . Cancer Father     THROAT  . Alcohol abuse Father   . Seizures Son   . Cancer Maternal Aunt     SKIN  . Arthritis Maternal Grandmother   . Diabetes Paternal Grandmother   . Cancer Maternal Aunt     LUNG  . Cerebral palsy Cousin     History  Substance Use Topics  . Smoking status: Current Some Day Smoker -- 0.25 packs/day for 12 years    Types: Cigarettes  . Smokeless tobacco: Never Used  . Alcohol Use: No    Allergies: No Known Allergies  Prescriptions prior to admission  Medication Sig Dispense Refill  . calcium carbonate (TUMS - DOSED IN MG ELEMENTAL CALCIUM) 500 MG chewable tablet Chew 2 tablets by mouth 4 (four) times daily as needed for heartburn.       . Prenatal Vit-Fe Fumarate-FA (MULTIVITAMIN-PRENATAL) 27-0.8 MG TABS Take 1 tablet by mouth daily.        ROS: see HPI above, all other systems are negative   Physical Exam   Blood pressure 126/80, pulse 109, temperature 98.3 F (36.8 C), temperature  source Oral, resp. rate 16, height 5' (1.524 m), weight 135 lb 9.6 oz (61.508 kg), last menstrual period 08/28/2012, SpO2 100.00%.  Chest: Clear Heart: RRR Abdomen: gravid, NT Extremities: WNL  Dilation: 3 Effacement (%): 60 Cervical Position: Anterior Station: -2 Presentation: Vertex Exam by:: J Jaemarie Hochberg CNM  FHT: Reactive NST UCs: Q 2-4 min     ED Course  IUP at [redacted]w[redacted]d Early vs active labor  Amnisure - neg  No cervical change in 1 hour D/c home with labor precausions    Haroldine Laws CNM, MSN 05/02/2013 4:30 PM

## 2013-05-06 ENCOUNTER — Encounter (HOSPITAL_COMMUNITY): Payer: Self-pay | Admitting: *Deleted

## 2013-05-06 ENCOUNTER — Inpatient Hospital Stay (HOSPITAL_COMMUNITY)
Admission: AD | Admit: 2013-05-06 | Discharge: 2013-05-08 | DRG: 775 | Disposition: A | Discharge status: Home / Self Care | Payer: Medicaid Other | Source: Ambulatory Visit | Attending: Obstetrics and Gynecology | Admitting: Obstetrics and Gynecology

## 2013-05-06 ENCOUNTER — Encounter (HOSPITAL_COMMUNITY): Payer: Self-pay | Admitting: Anesthesiology

## 2013-05-06 ENCOUNTER — Inpatient Hospital Stay (HOSPITAL_COMMUNITY): Payer: Medicaid Other | Admitting: Anesthesiology

## 2013-05-06 DIAGNOSIS — IMO0001 Reserved for inherently not codable concepts without codable children: Secondary | ICD-10-CM

## 2013-05-06 LAB — CBC
Hemoglobin: 13.1 g/dL (ref 12.0–15.0)
RBC: 4.16 MIL/uL (ref 3.87–5.11)
WBC: 18.1 10*3/uL — ABNORMAL HIGH (ref 4.0–10.5)

## 2013-05-06 LAB — RPR: RPR Ser Ql: NONREACTIVE

## 2013-05-06 MED ORDER — FLEET ENEMA 7-19 GM/118ML RE ENEM: 1.0000 | ENEMA | RECTAL | Status: DC | PRN

## 2013-05-06 MED ORDER — ONDANSETRON HCL 4 MG/2ML IJ SOLN: 4.0000 mg | Freq: Four times a day (QID) | INTRAMUSCULAR | Status: DC | PRN

## 2013-05-06 MED ORDER — DIPHENHYDRAMINE HCL 50 MG/ML IJ SOLN: 12.5000 mg | INTRAMUSCULAR | Status: DC | PRN

## 2013-05-06 MED ORDER — CALCIUM CARBONATE ANTACID 500 MG PO CHEW: 2.0000 | CHEWABLE_TABLET | Freq: Four times a day (QID) | ORAL | Status: DC | PRN

## 2013-05-06 MED ORDER — EPHEDRINE 5 MG/ML INJ
10.0000 mg | INTRAVENOUS | Status: DC | PRN
  Filled 2013-05-06: qty 2
  Filled 2013-05-06: qty 4

## 2013-05-06 MED ORDER — PHENYLEPHRINE 40 MCG/ML (10ML) SYRINGE FOR IV PUSH (FOR BLOOD PRESSURE SUPPORT)
80.0000 ug | PREFILLED_SYRINGE | INTRAVENOUS | Status: DC | PRN
  Filled 2013-05-06: qty 5
  Filled 2013-05-06: qty 2

## 2013-05-06 MED ORDER — CITRIC ACID-SODIUM CITRATE 334-500 MG/5ML PO SOLN: 30.0000 mL | ORAL | Status: DC | PRN

## 2013-05-06 MED ORDER — BENZOCAINE-MENTHOL 20-0.5 % EX AERO: 1.0000 "application " | INHALATION_SPRAY | CUTANEOUS | Status: DC | PRN

## 2013-05-06 MED ORDER — SIMETHICONE 80 MG PO CHEW: 80.0000 mg | CHEWABLE_TABLET | ORAL | Status: DC | PRN

## 2013-05-06 MED ORDER — LACTATED RINGERS IV SOLN: 500.0000 mL | INTRAVENOUS | Status: DC | PRN

## 2013-05-06 MED ORDER — SODIUM BICARBONATE 8.4 % IV SOLN
INTRAVENOUS | Status: DC | PRN
  Administered 2013-05-06: 5 mL via EPIDURAL

## 2013-05-06 MED ORDER — DIPHENHYDRAMINE HCL 25 MG PO CAPS: 25.0000 mg | ORAL_CAPSULE | Freq: Four times a day (QID) | ORAL | Status: DC | PRN

## 2013-05-06 MED ORDER — PHENYLEPHRINE 40 MCG/ML (10ML) SYRINGE FOR IV PUSH (FOR BLOOD PRESSURE SUPPORT)
80.0000 ug | PREFILLED_SYRINGE | INTRAVENOUS | Status: DC | PRN
  Filled 2013-05-06: qty 2

## 2013-05-06 MED ORDER — OXYCODONE-ACETAMINOPHEN 5-325 MG PO TABS: 1.0000 | ORAL_TABLET | ORAL | Status: DC | PRN

## 2013-05-06 MED ORDER — FENTANYL 2.5 MCG/ML BUPIVACAINE 1/10 % EPIDURAL INFUSION (WH - ANES)
14.0000 mL/h | INTRAMUSCULAR | Status: DC | PRN
  Administered 2013-05-06: 14 mL/h via EPIDURAL
  Filled 2013-05-06: qty 125

## 2013-05-06 MED ORDER — ONDANSETRON HCL 4 MG/2ML IJ SOLN: 4.0000 mg | INTRAMUSCULAR | Status: DC | PRN

## 2013-05-06 MED ORDER — WITCH HAZEL-GLYCERIN EX PADS: 1.0000 "application " | MEDICATED_PAD | CUTANEOUS | Status: DC | PRN

## 2013-05-06 MED ORDER — OXYTOCIN 40 UNITS IN LACTATED RINGERS INFUSION - SIMPLE MED
62.5000 mL/h | INTRAVENOUS | Status: DC
  Administered 2013-05-06: 62.5 mL/h via INTRAVENOUS
  Filled 2013-05-06: qty 1000

## 2013-05-06 MED ORDER — ACETAMINOPHEN 325 MG PO TABS: 650.0000 mg | ORAL_TABLET | ORAL | Status: DC | PRN

## 2013-05-06 MED ORDER — PRENATAL MULTIVITAMIN CH
1.0000 | ORAL_TABLET | Freq: Every day | ORAL | Status: DC
  Administered 2013-05-07: 1 via ORAL
  Filled 2013-05-06: qty 1

## 2013-05-06 MED ORDER — LIDOCAINE HCL (PF) 1 % IJ SOLN
30.0000 mL | INTRAMUSCULAR | Status: DC | PRN
  Filled 2013-05-06 (×2): qty 30

## 2013-05-06 MED ORDER — EPHEDRINE 5 MG/ML INJ
10.0000 mg | INTRAVENOUS | Status: DC | PRN
  Filled 2013-05-06: qty 2

## 2013-05-06 MED ORDER — SENNOSIDES-DOCUSATE SODIUM 8.6-50 MG PO TABS
2.0000 | ORAL_TABLET | Freq: Every day | ORAL | Status: DC
  Administered 2013-05-06 – 2013-05-07 (×2): 2 via ORAL

## 2013-05-06 MED ORDER — LANOLIN HYDROUS EX OINT: TOPICAL_OINTMENT | CUTANEOUS | Status: DC | PRN

## 2013-05-06 MED ORDER — NALBUPHINE SYRINGE 5 MG/0.5 ML
5.0000 mg | INJECTION | INTRAMUSCULAR | Status: DC | PRN
  Administered 2013-05-06: 5 mg via INTRAVENOUS
  Filled 2013-05-06 (×2): qty 0.5

## 2013-05-06 MED ORDER — LACTATED RINGERS IV SOLN: 500.0000 mL | Freq: Once | INTRAVENOUS | Status: DC

## 2013-05-06 MED ORDER — ZOLPIDEM TARTRATE 5 MG PO TABS: 5.0000 mg | ORAL_TABLET | Freq: Every evening | ORAL | Status: DC | PRN

## 2013-05-06 MED ORDER — TETANUS-DIPHTH-ACELL PERTUSSIS 5-2.5-18.5 LF-MCG/0.5 IM SUSP
0.5000 mL | Freq: Once | INTRAMUSCULAR | Status: AC
  Administered 2013-05-08: 0.5 mL via INTRAMUSCULAR
  Filled 2013-05-06: qty 0.5

## 2013-05-06 MED ORDER — OXYTOCIN BOLUS FROM INFUSION: 500.0000 mL | INTRAVENOUS | Status: DC

## 2013-05-06 MED ORDER — OXYCODONE-ACETAMINOPHEN 5-325 MG PO TABS
1.0000 | ORAL_TABLET | ORAL | Status: DC | PRN
  Filled 2013-05-06: qty 2

## 2013-05-06 MED ORDER — LACTATED RINGERS IV SOLN
INTRAVENOUS | Status: DC
  Administered 2013-05-06 (×2): via INTRAVENOUS

## 2013-05-06 MED ORDER — IBUPROFEN 600 MG PO TABS
600.0000 mg | ORAL_TABLET | Freq: Four times a day (QID) | ORAL | Status: DC
  Administered 2013-05-06 – 2013-05-08 (×5): 600 mg via ORAL
  Filled 2013-05-06 (×5): qty 1

## 2013-05-06 MED ORDER — ONDANSETRON HCL 4 MG PO TABS: 4.0000 mg | ORAL_TABLET | ORAL | Status: DC | PRN

## 2013-05-06 MED ORDER — DIBUCAINE 1 % RE OINT: 1.0000 "application " | TOPICAL_OINTMENT | RECTAL | Status: DC | PRN

## 2013-05-06 MED ORDER — IBUPROFEN 600 MG PO TABS
600.0000 mg | ORAL_TABLET | Freq: Four times a day (QID) | ORAL | Status: DC | PRN
  Administered 2013-05-06: 600 mg via ORAL
  Filled 2013-05-06: qty 1

## 2013-05-06 NOTE — H&P (Signed)
Kelsey Richards is a 32 y.o. female at 70w 1d gestation admitted due to labor.    History Pt presents to MAU with c/o onset of regular contractions with increased frequency and intensity since 0630 this AM.  Denies any ROM or bldg.  Reports normal fetal activity.    Hx Present Pregnancy:  Pt entered OB care at [redacted]wks gestation.  EDC established by [place] at 16w 4d.  [place] for anatomy at 19wk with choroid plexus cysts and velamentous cord insertion noted.  Quad screen obtained at that time returned with no increased risk for aneuploidy.  Pt also underwent early glucola with normoal results.  Repeat glucola at 28wks with normal results.  Ultrasounds obtained every 4 weeks from 28 wks with adequate interval fetal growth and normal amniotic fluid volumes.  Pt with persistent sciatica type pain throughout the pregnancy treated with conservative measures.    OB History   Grav Para Term Preterm Abortions TAB SAB Ect Mult Living   7 2 1 1 4  3   2      Obstetric Comments   2009 GDM ON INSULIN; INCREASED LFT'S; MGSO4     Past Medical History  Diagnosis Date  . GERD (gastroesophageal reflux disease)     DURING PREGNANCY  . Preterm labor 200 9  . Infection     UTI OCC  . Abnormal Pap smear 2001    LEEP; LAST PAP ?  Marland Kitchen H/O gestational diabetes in prior pregnancy, currently pregnant   . Urinary tract infection    Past Surgical History  Procedure Laterality Date  . Leep  2001  . Wisdom tooth extraction  2008  . Induced abortion    . Dilation and curettage of uterus     Family History: family history includes Alcohol abuse in her father; Arthritis in her maternal grandmother; Cancer in her father, maternal aunts, and mother; Cerebral palsy in her cousin; Diabetes in her paternal grandmother; and Seizures in her son. Social History:  reports that she has been smoking Cigarettes.  She has a 3 pack-year smoking history. She has never used smokeless tobacco. She reports that she does not drink alcohol or use  illicit drugs.   Prenatal Transfer Tool  Maternal Diabetes: No Genetic Screening: Normal Maternal Ultrasounds/Referrals: Abnormal:  Findings:   Isolated choroid plexus cyst, Other:velamentous cord insertion Fetal Ultrasounds or other Referrals:  None Maternal Substance Abuse:  No Significant Maternal Medications:  None Significant Maternal Lab Results:  None Other Comments:  None  Review of Systems  Constitutional: Negative.   HENT: Negative.   Eyes: Negative.   Respiratory: Negative.   Cardiovascular: Negative.   Gastrointestinal: Negative.   Genitourinary: Negative.   Musculoskeletal: Negative.   Skin: Negative.   Neurological: Negative.   Endo/Heme/Allergies: Negative.   Psychiatric/Behavioral: Negative.     Dilation: 5.5 Effacement (%): 90 Station: -1 Blood pressure 127/92, pulse 121, temperature 97.9 F (36.6 C), temperature source Oral, resp. rate 22, height 4' 11.5" (1.511 m), weight 61.689 kg (136 lb), last menstrual period 08/28/2012. Maternal Exam:  Uterine Assessment: Contraction strength is moderate.  Contraction frequency is regular.   Abdomen: Patient reports no abdominal tenderness. Fundal height is 38.   Estimated fetal weight is 7.5.   Fetal presentation: vertex  Introitus: Normal vulva. Normal vagina.  Ferning test: not done.  Nitrazine test: not done. Amniotic fluid character: not assessed.  Pelvis: adequate for delivery.   Cervix: Cervix evaluated by digital exam.     Fetal Exam Fetal Monitor Review:  Mode: ultrasound.   Baseline rate: 145.  Variability: moderate (6-25 bpm).   Pattern: accelerations present and no decelerations.    Fetal State Assessment: Category I - tracings are normal.     Physical Exam  Constitutional: She is oriented to person, place, and time. She appears well-developed and well-nourished.  HENT:  Head: Normocephalic and atraumatic.  Right Ear: External ear normal.  Left Ear: External ear normal.  Nose: Nose  normal.  Eyes: Conjunctivae are normal. Pupils are equal, round, and reactive to light.  Neck: Normal range of motion. Neck supple.  Cardiovascular: Normal rate, regular rhythm and intact distal pulses.   Respiratory: Effort normal and breath sounds normal.  GI: Soft. Bowel sounds are normal.  Genitourinary: Vagina normal and uterus normal.  Musculoskeletal: Normal range of motion.  Neurological: She is alert and oriented to person, place, and time. She has normal reflexes.  Skin: Skin is warm and dry.  Psychiatric: She has a normal mood and affect. Her behavior is normal. Thought content normal.    Prenatal labs: ABO, Rh: A/POS/-- (12/27 1425) Antibody: NEG (12/27 1425) Rubella: 1.07 (12/27 1425) RPR: NON REAC (03/12 1501)  HBsAg: NEGATIVE (12/27 1425)  HIV: NON REACTIVE (12/27 1425)  GBS:  Negative  Assessment/Plan: IUP at 39w 1d Active labor  Admit to [facility] per consult with Dr. Dion Body. Routine admission orders.  Pt plans epidural anesthesia.    Kelsey Richards O. 05/06/2013, 12:12 PM

## 2013-05-06 NOTE — Anesthesia Procedure Notes (Signed)

## 2013-05-06 NOTE — Anesthesia Preprocedure Evaluation (Signed)
Anesthesia Evaluation  Patient identified by MRN, date of birth, ID band Patient awake    Reviewed: Allergy & Precautions, H&P , Patient's Chart, lab work & pertinent test results  Airway Mallampati: II  TM Distance: >3 FB Neck ROM: full    Dental  (+) Teeth Intact   Pulmonary  breath sounds clear to auscultation        Cardiovascular Rhythm:regular Rate:Normal     Neuro/Psych    GI/Hepatic GERD-  ,  Endo/Other    Renal/GU      Musculoskeletal   Abdominal   Peds  Hematology   Anesthesia Other Findings       Reproductive/Obstetrics (+) Pregnancy                             Anesthesia Physical Anesthesia Plan  ASA: II  Anesthesia Plan: Epidural   Post-op Pain Management:    Induction:   Airway Management Planned:   Additional Equipment:   Intra-op Plan:   Post-operative Plan:   Informed Consent: I have reviewed the patients History and Physical, chart, labs and discussed the procedure including the risks, benefits and alternatives for the proposed anesthesia with the patient or authorized representative who has indicated his/her understanding and acceptance.   Dental Advisory Given  Plan Discussed with:   Anesthesia Plan Comments: (Labs checked- platelets confirmed with RN in room. Fetal heart tracing, per RN, reported to be stable enough for sitting procedure. Discussed epidural, and patient consents to the procedure:  included risk of possible headache,backache, failed block, allergic reaction, and nerve injury. This patient was asked if she had any questions or concerns before the procedure started.)        Anesthesia Quick Evaluation  

## 2013-05-06 NOTE — MAU Note (Signed)
Contractions started this morning around 0630, no leaking, pinkish mucous.  Every 2-3 min.  Was 3 cm

## 2013-05-06 NOTE — MAU Note (Signed)
Pt in c/o ucs q2-3 minutes since 0630.  Reports small amount of bloody show.  Denies any leaking.  + FM.

## 2013-05-07 LAB — CBC
Hemoglobin: 11 g/dL — ABNORMAL LOW (ref 12.0–15.0)
MCH: 31.3 pg (ref 26.0–34.0)
MCHC: 34.6 g/dL (ref 30.0–36.0)
MCV: 90.3 fL (ref 78.0–100.0)
Platelets: 162 10*3/uL (ref 150–400)
RBC: 3.52 MIL/uL — ABNORMAL LOW (ref 3.87–5.11)

## 2013-05-07 NOTE — Progress Notes (Signed)
Patient ID: Kelsey Richards, female   DOB: 1980/12/05, 32 y.o.   MRN: [id number] Post Partum Day 1  Subjective: no complaints, up ad lib without syncope, voiding, tolerating PO,  Pain well controlled with po meds,  BF initiated  Mood stable, bonding well Contraception: considering nexplanon    Objective: Blood pressure 100/74, pulse 94, temperature 98.3 F (36.8 C), temperature source Oral, resp. rate 16, height 4' 11.5" (1.511 m), weight 136 lb (61.689 kg), last menstrual period 08/28/2012, SpO2 97.00%, unknown if currently breastfeeding.  Physical Exam:  General: NAD  Lochia: appropriate Uterine Fundus: firm Perineum: intact  DVT Evaluation: No evidence of DVT seen on physical exam. Negative Homan's sign. No significant calf/ankle edema.   Recent Labs  05/06/13 1225 05/07/13 0625  HGB 13.1 11.0*  HCT 37.8 31.8*    Assessment/Plan: Plan for discharge tomorrow, Breastfeeding and Contraception nexplanon       LOS: 1 day   Brentton Wardlow M 05/07/2013, 12:38 PM

## 2013-05-07 NOTE — Anesthesia Postprocedure Evaluation (Signed)
  Anesthesia Post-op Note  Patient: Kelsey Richards  Procedure(s) Performed: * No procedures listed *  Patient Location: PACU and Mother/Baby  Anesthesia Type:Epidural  Level of Consciousness: awake, alert  and oriented  Airway and Oxygen Therapy: Patient Spontanous Breathing  Post-op Pain: none  Post-op Assessment: Post-op Vital signs reviewed, Patient's Cardiovascular Status Stable, No headache, No backache, No residual numbness and No residual motor weakness  Post-op Vital Signs: Reviewed and stable  Complications: No apparent anesthesia complications

## 2013-05-08 MED ORDER — IBUPROFEN 600 MG PO TABS: 600.0000 mg | ORAL_TABLET | Freq: Four times a day (QID) | ORAL | Status: DC

## 2013-05-08 NOTE — Discharge Summary (Signed)
Vaginal Delivery Discharge Summary  Kelsey Richards  DOB:    03/19/1981 MRN:    [id number] CSN:    [id number]  Date of admission:                  05/06/13  Date of discharge:                   05/08/13  Procedures this admission: SVD without repair  Date of Delivery: 05/06/13 by Kelsey Richards  Newborn Data:  Live born female  Birth Weight: 7 lb 1 oz (3204 g) APGAR: 8, 8  Home with mother. Name: "Kelsey Richards" Circumcision Plan: n/a  History of Present Illness:  [name] is a 32 y.o. female, [phone], who presents at [redacted]w[redacted]d weeks gestation. The patient has been followed at the [facility] of [facility]. She was admitted onset of labor. Her pregnancy has been complicated by:   Patient Active Problem List   Diagnosis Date Noted  . Normal spontaneous vaginal delivery 05/06/2013  . Fetal choroid plexus cysts affecting antepartum care of mother 12/21/2012  . Velamentous insertion of umbilical cord 12/21/2012  . Hx of gestational diabetes in prior pregnancy, currently pregnant 11/14/2012  . History of hemolysis, elevated liver enzymes, and low platelet (HELLP) syndrome, currently pregnant 11/14/2012   Hospital course:  The patient was admitted for labor.   Her labor was not complicated. She proceeded to have a vaginal delivery of a healthy infant. Her delivery was not complicated. Her postpartum course was not complicated.  She was discharged to home on postpartum day 2 doing well.  Feeding:  breast  Contraception:  Nexplanon  Discharge hemoglobin:  Hemoglobin  Date Value Range Status  05/07/2013 11.0* 12.0 - 15.0 g/dL Final     HCT  Date Value Range Status  05/07/2013 31.8* 36.0 - 46.0 % Final    Discharge Physical Exam:   General: alert, cooperative and no distress Lochia: appropriate Uterine Fundus: firm Incision: n/a DVT Evaluation: No evidence of DVT seen on physical exam. Negative Homan's  sign.  Intrapartum Procedures: spontaneous vaginal delivery Postpartum Procedures: none Complications-Operative and Postpartum: none  Discharge Diagnoses: Term Pregnancy-delivered  Discharge Information:  Activity:           pelvic rest Diet:                routine Medications: PNV and Ibuprofen Condition:      stable Instructions:  refer to practice specific booklet Discharge to: home  Follow-up Information   Follow up with [facility]. Schedule an appointment as soon as possible for a visit in 5 weeks. (Call with any questions or concerns.)    Contact information:   [address] [place] [address] [phone]       Haroldine Laws 05/08/2013

## 2013-05-12 NOTE — Progress Notes (Signed)
Kelsey Richards is a 32 y.o. [phone] at [redacted]w[redacted]d by ultrasound admitted for active labor  Subjective: Pt comfortable with epidural at present.  No current complaints.   Objective: BP 116/73  Pulse 101  Temp(Src) 98.8 F (Oral)  Resp 18  Ht 4' 11.5" (1.511 m)  Wt 61.689 kg (136 lb)  BMI 27.02 kg/m2  SpO2 97%  LMP 08/28/2012     FHT:  FHR: 130 bpm, variability: moderate,  accelerations:  Present,  decelerations:  Absent UC:   regular, every 1.5-2.5 minutes SVE:   Dilation: 8 Effacement (%): 100 Station: -1 Exam by:: Kelsey Richards, CNM BBOW-AROM performed with return of moderate amt of clear fluid.     Assessment / Plan: Spontaneous labor, progressing normally  Labor: Progressing normally Preeclampsia:  no signs or symptoms of toxicity Fetal Wellbeing:  Category I Pain Control:  Epidural I/D:  n/a Anticipated MOD:  NSVD  Will continue expectant management at present.    Kelsey Richards O. 05/12/2013, 10:02 PM

## 2013-08-17 ENCOUNTER — Other Ambulatory Visit: Payer: Self-pay

## 2014-08-13 ENCOUNTER — Encounter (HOSPITAL_COMMUNITY): Payer: Self-pay | Admitting: *Deleted

## 2016-11-23 ENCOUNTER — Ambulatory Visit (INDEPENDENT_AMBULATORY_CARE_PROVIDER_SITE_OTHER): Payer: Self-pay

## 2016-11-23 ENCOUNTER — Ambulatory Visit (HOSPITAL_COMMUNITY)
Admission: EM | Admit: 2016-11-23 | Discharge: 2016-11-23 | Disposition: A | Discharge status: Home / Self Care | Payer: Medicaid Other | Attending: Family Medicine | Admitting: Family Medicine

## 2016-11-23 ENCOUNTER — Encounter (HOSPITAL_COMMUNITY): Payer: Self-pay | Admitting: Emergency Medicine

## 2016-11-23 DIAGNOSIS — R0789 Other chest pain: Secondary | ICD-10-CM

## 2016-11-23 MED ORDER — IBUPROFEN 800 MG PO TABS: 800.0000 mg | ORAL_TABLET | Freq: Three times a day (TID) | ORAL | 0 refills | Status: DC

## 2016-11-23 NOTE — Discharge Instructions (Signed)
See your Physicain for recheck in 2-3 days °

## 2016-11-23 NOTE — ED Provider Notes (Signed)
CSN: [id number]     Arrival date & time 11/23/16  1602 History   None    Chief Complaint  Patient presents with  . Chest Pain   (Consider location/radiation/quality/duration/timing/severity/associated sxs/prior Treatment) The history is provided by the patient. No language interpreter was used.  Chest Pain  Pain location:  L chest and R chest Pain quality: aching   Pain radiates to:  R shoulder Pain severity:  Moderate Onset quality:  Gradual Timing:  Constant Progression:  Worsening Chronicity:  New Context: breathing, movement and raising an arm   Relieved by:  Nothing Worsened by:  Nothing Ineffective treatments:  None tried Associated symptoms: numbness   Associated symptoms: no abdominal pain and no fever   Risk factors: no hypertension and no prior DVT/PE   Pt complains of pain in her right chest and her right arm. Pt becamed concerned when she had a numb feeling in her right arm  Past Medical History:  Diagnosis Date  . Abnormal Pap smear 2001   LEEP; LAST PAP ?  Marland Kitchen GERD (gastroesophageal reflux disease)    DURING PREGNANCY  . H/O gestational diabetes in prior pregnancy, currently pregnant   . Infection    UTI OCC  . Preterm labor 200 9  . Urinary tract infection    Past Surgical History:  Procedure Laterality Date  . DILATION AND CURETTAGE OF UTERUS    . INDUCED ABORTION    . LEEP  2001  . WISDOM TOOTH EXTRACTION  2008   Family History  Problem Relation Age of Onset  . Cancer Mother     MELANOMA  . Cancer Father     THROAT  . Alcohol abuse Father   . Seizures Son   . Cancer Maternal Aunt     SKIN  . Arthritis Maternal Grandmother   . Diabetes Paternal Grandmother   . Cancer Maternal Aunt     LUNG  . Cerebral palsy Cousin    Social History  Substance Use Topics  . Smoking status: Current Some Day Smoker    Packs/day: 0.25    Years: 12.00    Types: Cigarettes  . Smokeless tobacco: Never Used  . Alcohol use No   OB History    Gravida Para  Term Preterm AB Living   7 3 2 1 4 3    SAB TAB Ectopic Multiple Live Births   3       3      Obstetric Comments   2009 GDM ON INSULIN; INCREASED LFT'S; MGSO4     Review of Systems  Constitutional: Negative for fever.  Cardiovascular: Positive for chest pain.  Gastrointestinal: Negative for abdominal pain.  Neurological: Positive for numbness.  All other systems reviewed and are negative.   Allergies  Patient has no known allergies.  Home Medications   Prior to Admission medications   Not on File   Meds Ordered and Administered this Visit  Medications - No data to display  BP 125/83 (BP Location: Left Arm)   Pulse 93   Temp 98.4 F (36.9 C) (Oral)   Resp 18   SpO2 99%  No data found.   Physical Exam  Constitutional: She appears well-developed and well-nourished.  HENT:  Head: Normocephalic and atraumatic.  Eyes: EOM are normal. Pupils are equal, round, and reactive to light.  Neck: Normal range of motion. Neck supple.  Cardiovascular: Normal rate and regular rhythm.   Pulmonary/Chest: Effort normal. She exhibits tenderness.  Abdominal: Soft.  Musculoskeletal: Normal range of  motion. She exhibits no tenderness.  Neurological: She is alert.  Skin: Skin is warm.  Psychiatric: She has a normal mood and affect.  Nursing note and vitals reviewed.   Urgent Care Course     Procedures (including critical care time)  Labs Review Labs Reviewed - No data to display  Imaging Review Dg Chest 2 View  Result Date: 11/23/2016 CLINICAL DATA:  Chest pain and right arm numbness EXAM: CHEST  2 VIEW COMPARISON:  None. FINDINGS: The lungs are well inflated. Cardiomediastinal contours are normal. No pneumothorax or pleural effusion. No focal airspace consolidation or pulmonary edema. IMPRESSION: Clear lungs. Electronically Signed   By: Deatra RobinsonKevin  Herman M.D.   On: 11/23/2016 17:37     Visual Acuity Review  Right Eye Distance:   Left Eye Distance:   Bilateral Distance:     Right Eye Near:   Left Eye Near:    Bilateral Near:         MDM I doubt dvt.  EKG nonacute  Chest xray normal.  Pt advised to recheck with primary in 2-3 days.     1. Chest wall pain    An After Visit Summary was printed and given to the patient. Meds ordered this encounter  Medications  . ibuprofen (ADVIL,MOTRIN) 800 MG tablet    Sig: Take 1 tablet (800 mg total) by mouth 3 (three) times daily.    Dispense:  21 tablet    Refill:  0    Order Specific Question:   Supervising Provider    Answer:   Bradd CanaryKINDL, JAMES D [[id number]]      Lonia SkinnerLeslie K HigginsvilleSofia, PA-C 11/23/16 1811

## 2016-11-23 NOTE — ED Triage Notes (Signed)
[facility] a [facility] of upper bilteral chest pain and right arm numbness. The patient reported that the pain is worse upon inspiration and when laying flat. The patient reported the pain started yesterday and denied any N/V.

## 2017-08-09 ENCOUNTER — Ambulatory Visit (HOSPITAL_COMMUNITY)
Admission: EM | Admit: 2017-08-09 | Discharge: 2017-08-09 | Disposition: A | Discharge status: Home / Self Care | Payer: Self-pay | Attending: Emergency Medicine | Admitting: Emergency Medicine

## 2017-08-09 ENCOUNTER — Ambulatory Visit (INDEPENDENT_AMBULATORY_CARE_PROVIDER_SITE_OTHER): Payer: Self-pay

## 2017-08-09 ENCOUNTER — Encounter (HOSPITAL_COMMUNITY): Payer: Self-pay | Admitting: Emergency Medicine

## 2017-08-09 DIAGNOSIS — R05 Cough: Secondary | ICD-10-CM

## 2017-08-09 DIAGNOSIS — R059 Cough, unspecified: Secondary | ICD-10-CM

## 2017-08-09 MED ORDER — FLUTICASONE PROPIONATE 50 MCG/ACT NA SUSP: 2.0000 | Freq: Every day | NASAL | 0 refills | Status: AC

## 2017-08-09 MED ORDER — FAMOTIDINE 20 MG PO TABS: 20.0000 mg | ORAL_TABLET | Freq: Two times a day (BID) | ORAL | 0 refills | Status: AC

## 2017-08-09 MED ORDER — BENZONATATE 200 MG PO CAPS: 200.0000 mg | ORAL_CAPSULE | Freq: Three times a day (TID) | ORAL | 0 refills | Status: AC | PRN

## 2017-08-09 MED ORDER — HYDROCOD POLST-CPM POLST ER 10-8 MG/5ML PO SUER: 5.0000 mL | Freq: Two times a day (BID) | ORAL | 0 refills | Status: AC | PRN

## 2017-08-09 NOTE — ED Provider Notes (Signed)
HPI  SUBJECTIVE:  Kelsey Richards is a 36 y.o. female who presents with 5 weeks of a dry, nonproductive cough.  She reports achy chest pain secondary to the cough and reports 3 episodes of posttussive emesis at night only.  She has tried TheraFlu, albuterol, Pulmicort, Robitussin, NyQuil, DayQuil, Mucinex.  Symptoms better with cough drops.  No aggravating factors.  She denies nasal congestion, rhinorrhea, postnasal drip, sinus pain or pressure.  No hemoptysis, fevers, night sweats, unintentional weight loss.  No wheezing, shortness of breath.  No GERD type symptoms.  No lower extremity edema, unintentional weight gain, orthopnea, PND, nocturia, abdominal pain, dyspnea on exertion.  She denies paroxysms of coughing.  She has never had symptoms like this before.  She denies exposure to pulmonary irritants.  Past medical history negative for asthma, emphysema, COPD, allergies, diabetes, hypertension, PE, DVT, cancer.  She has a history of GERD during pregnancy.  She is a smoker.  LMP: Implanon.  Denies possibility of being pregnant.  PMD: None.    Past Medical History:  Diagnosis Date  . Abnormal Pap smear 2001   LEEP; LAST PAP ?  Marland Kitchen GERD (gastroesophageal reflux disease)    DURING PREGNANCY  . H/O gestational diabetes in prior pregnancy, currently pregnant   . Infection    UTI OCC  . Preterm labor 200 9  . Urinary tract infection     Past Surgical History:  Procedure Laterality Date  . DILATION AND CURETTAGE OF UTERUS    . INDUCED ABORTION    . LEEP  2001  . WISDOM TOOTH EXTRACTION  2008    Family History  Problem Relation Age of Onset  . Cancer Mother        MELANOMA  . Cancer Father        THROAT  . Alcohol abuse Father   . Seizures Son   . Cancer Maternal Aunt        SKIN  . Arthritis Maternal Grandmother   . Diabetes Paternal Grandmother   . Cancer Maternal Aunt        LUNG  . Cerebral palsy Cousin     Social History  Substance Use Topics  . Smoking status: Current Some  Day Smoker    Packs/day: 0.25    Years: 12.00    Types: Cigarettes  . Smokeless tobacco: Never Used  . Alcohol use No    No current facility-administered medications for this encounter.   Current Outpatient Prescriptions:  .  benzonatate (TESSALON) 200 MG capsule, Take 1 capsule (200 mg total) by mouth 3 (three) times daily as needed for cough., Disp: 30 capsule, Rfl: 0 .  chlorpheniramine-HYDROcodone (TUSSIONEX PENNKINETIC ER) 10-8 MG/5ML SUER, Take 5 mLs by mouth every 12 (twelve) hours as needed for cough., Disp: 120 mL, Rfl: 0 .  famotidine (PEPCID) 20 MG tablet, Take 1 tablet (20 mg total) by mouth 2 (two) times daily., Disp: 40 tablet, Rfl: 0 .  fluticasone (FLONASE) 50 MCG/ACT nasal spray, Place 2 sprays into both nostrils daily., Disp: 16 g, Rfl: 0  No Known Allergies   ROS  As noted in HPI.   Physical Exam  BP 125/89 (BP Location: Right Arm)   Pulse 96   Temp 98.2 F (36.8 C) (Oral)   Resp 18   SpO2 100%   Constitutional: Well developed, well nourished, no acute distress Eyes:  EOMI, conjunctiva normal bilaterally HENT: Normocephalic, atraumatic,mucus membranes moist.  Positive clear rhinorrhea.  No sinus tenderness.  Normal oropharynx.  No obvious  postnasal drip or cobblestoning.  Respiratory: Normal inspiratory effort, lungs clear bilaterally.  Cardiovascular: Normal rate regular rhythm no murmurs rubs or gallops  GI: nondistended skin: No rash, skin intact Musculoskeletal: no deformities Neurologic: Alert & oriented x 3, no focal neuro deficits Psychiatric: Speech and behavior appropriate   ED Course   Medications - No data to display  Orders Placed This Encounter  Procedures  . DG Chest 2 View    Standing Status:   Standing    Number of Occurrences:   1    Order Specific Question:   Reason for Exam (SYMPTOM  OR DIAGNOSIS REQUIRED)    Answer:   cough x 5 weeks r/o mass, tb pulm edema,    No results found for this or any previous visit (from the  past 24 hour(s)). Dg Chest 2 View  Result Date: 08/09/2017 CLINICAL DATA:  Cough EXAM: CHEST  2 VIEW COMPARISON:  Chest radiograph 11/23/2016 FINDINGS: The heart size and mediastinal contours are within normal limits. Both lungs are clear. The visualized skeletal structures are unremarkable. IMPRESSION: No active cardiopulmonary disease. Electronically Signed   By: Deatra RobinsonKevin  Herman M.D.   On: 08/09/2017 20:53    ED Clinical Impression  Cough   ED Assessment/Plan  [place] Narcotic database reviewed for this patient, and feel that the risk/benefit ratio today is favorable for proceeding with a prescription for controlled substance.  No opiate prescriptions in 2 years.  Checking chest x-ray to rule out TB, mass, pulmonary edema, emphysema.  Favor cough caused by postnasal drip versus acid reflux.  She has no sinus tenderness.  She has already tried bronchodilators without improvement in her symptoms.  Doubt PE with normal vitals, no calf swelling or tenderness.  If her x-ray is normal, plan to send home with Flonase, Pepcid, supportive treatment with Tessalon, Tussionex.  Will provide referral to [facility] and primary care referral list for routine care.    Reviewed imaging independently.  Normal chest x-ray see radiology report for full details. Plan as above.  Discussed  imaging, MDM, plan and followup with patient.  patient agrees with plan.   Meds ordered this encounter  Medications  . benzonatate (TESSALON) 200 MG capsule    Sig: Take 1 capsule (200 mg total) by mouth 3 (three) times daily as needed for cough.    Dispense:  30 capsule    Refill:  0  . chlorpheniramine-HYDROcodone (TUSSIONEX PENNKINETIC ER) 10-8 MG/5ML SUER    Sig: Take 5 mLs by mouth every 12 (twelve) hours as needed for cough.    Dispense:  120 mL    Refill:  0  . fluticasone (FLONASE) 50 MCG/ACT nasal spray    Sig: Place 2 sprays into both nostrils daily.    Dispense:  16 g    Refill:  0  . famotidine  (PEPCID) 20 MG tablet    Sig: Take 1 tablet (20 mg total) by mouth 2 (two) times daily.    Dispense:  40 tablet    Refill:  0    *This clinic note was created using Scientist, clinical (histocompatibility and immunogenetics)Dragon dictation software. Therefore, there may be occasional mistakes despite careful proofreading.   ?    Domenick GongMortenson, Kyleah P[place]ene, MD 08/10/17 0800

## 2017-08-09 NOTE — ED Triage Notes (Signed)
Pt sts non productive cough x 5 weeks

## 2017-08-09 NOTE — Discharge Instructions (Addendum)
Try some saline nasal irrigation and Flonase.  Also try the Pepcid.  Tessalon and Tussionex will help with your symptoms.  Follow-up with [facility] in a week if not better or with one of the primary care physicians listed below for routine care.  Below is a list of primary care practices who are taking new patients for you to follow-up with. [facility] [address] [place], [place] [address] [phone]  [facility] [phone] [address] [place] [address]  [facility]: [address] [place] [id number]  [phone]  [facility]: [address] [place] [id number]   [phone]  [facility]: [address] [place] [address]  [phone]  [facility] : [address] [place] [address] [phone]  [facility]: [address] [place] [phone]  [facility]: [address] [place] [id number] [phone]  Dr. Oneal GroutMahima Pandey [address] [facility] [place] [id number]  [phone]  Dr. Jackie PlumGeorge Osei-Bonsu, [facility]. [address] [place], [place] [address]  [phone]  Go to www.goodrx.com to look up your medications. This will give you a list of where you can find your prescriptions at the most affordable prices. Or ask the pharmacist what the cash price is, or if they have any other discount programs available to help make your medication more affordable. This can be less expensive than what you would pay with insurance.

## 2019-07-03 IMAGING — DX DG CHEST 2V
2 series · 2 of 2 positions shown · non-contrast
Comparison: Chest radiograph 11/23/2016

CLINICAL DATA: Cough

EXAM:
CHEST  2 VIEW

[chest pa]
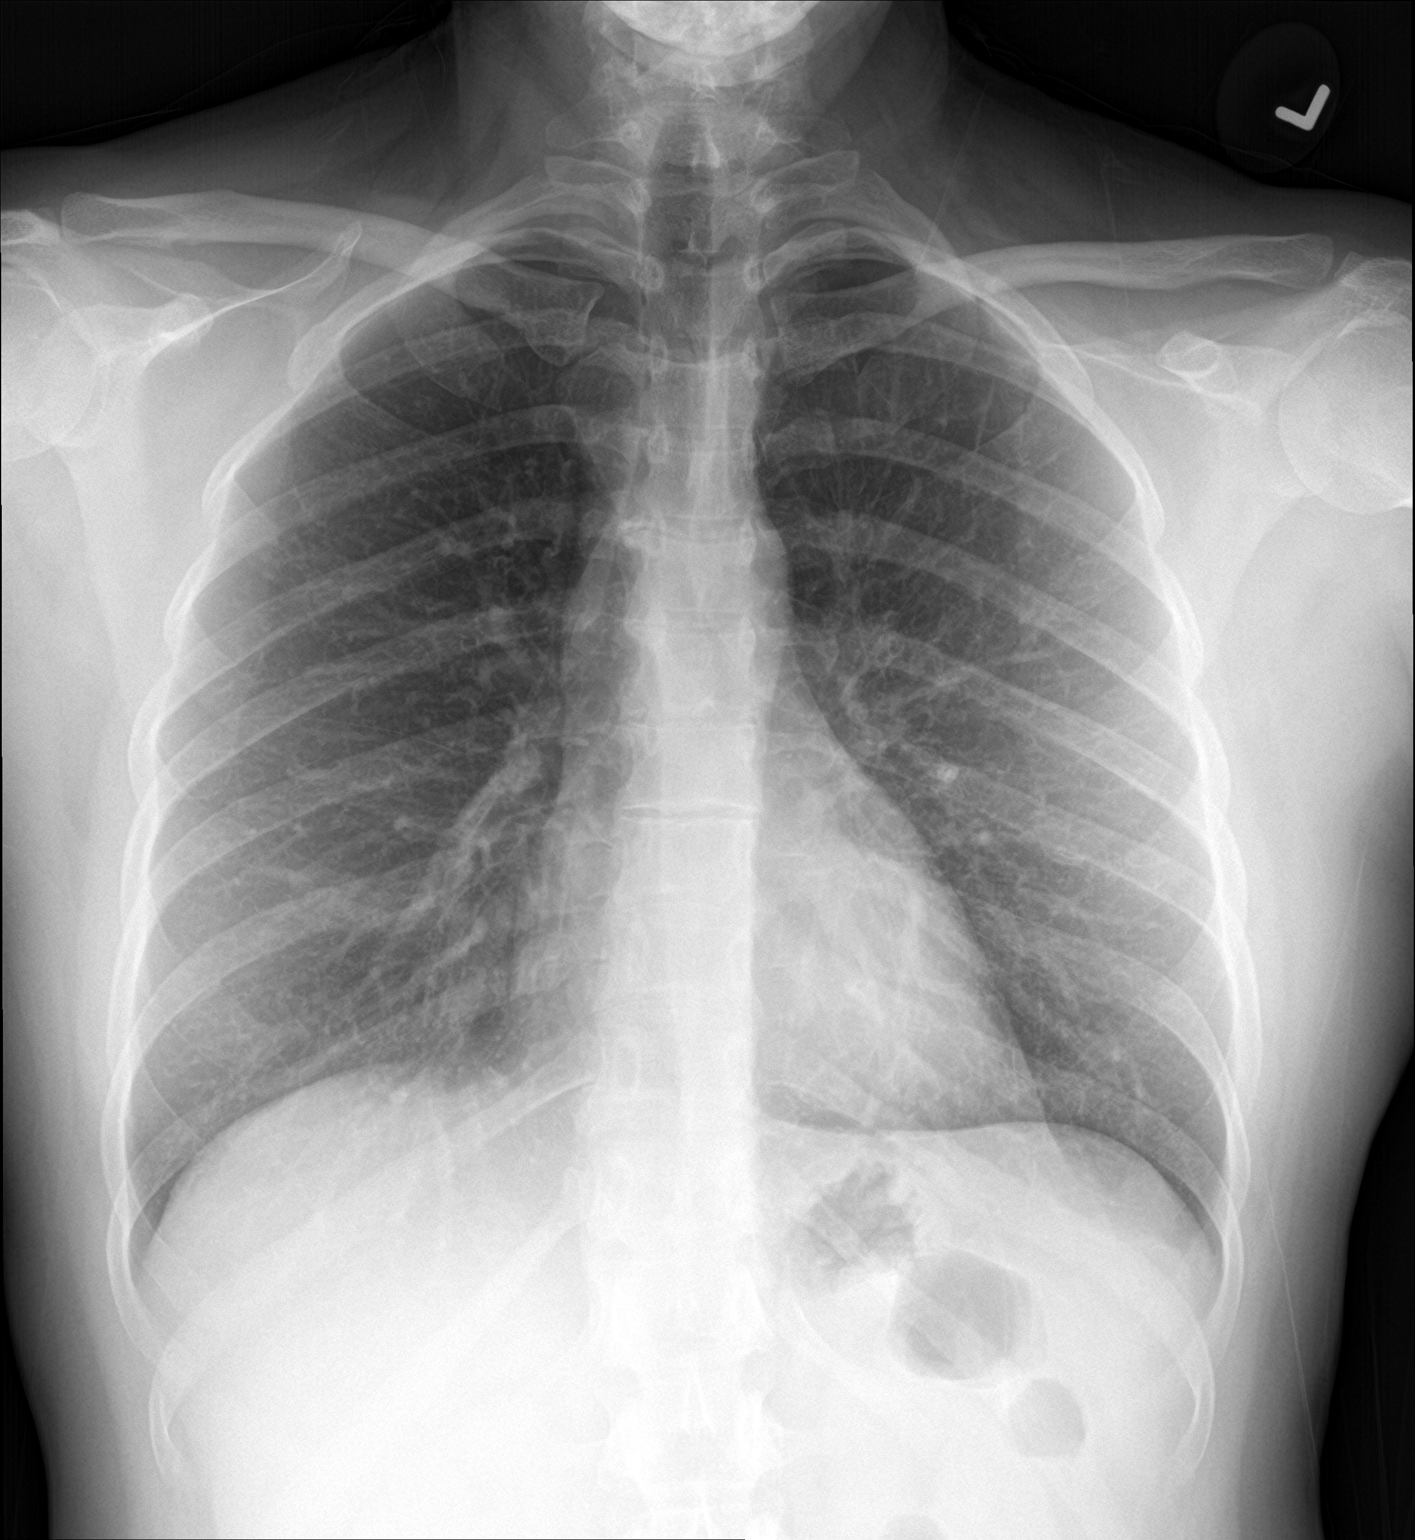

[chest lat]
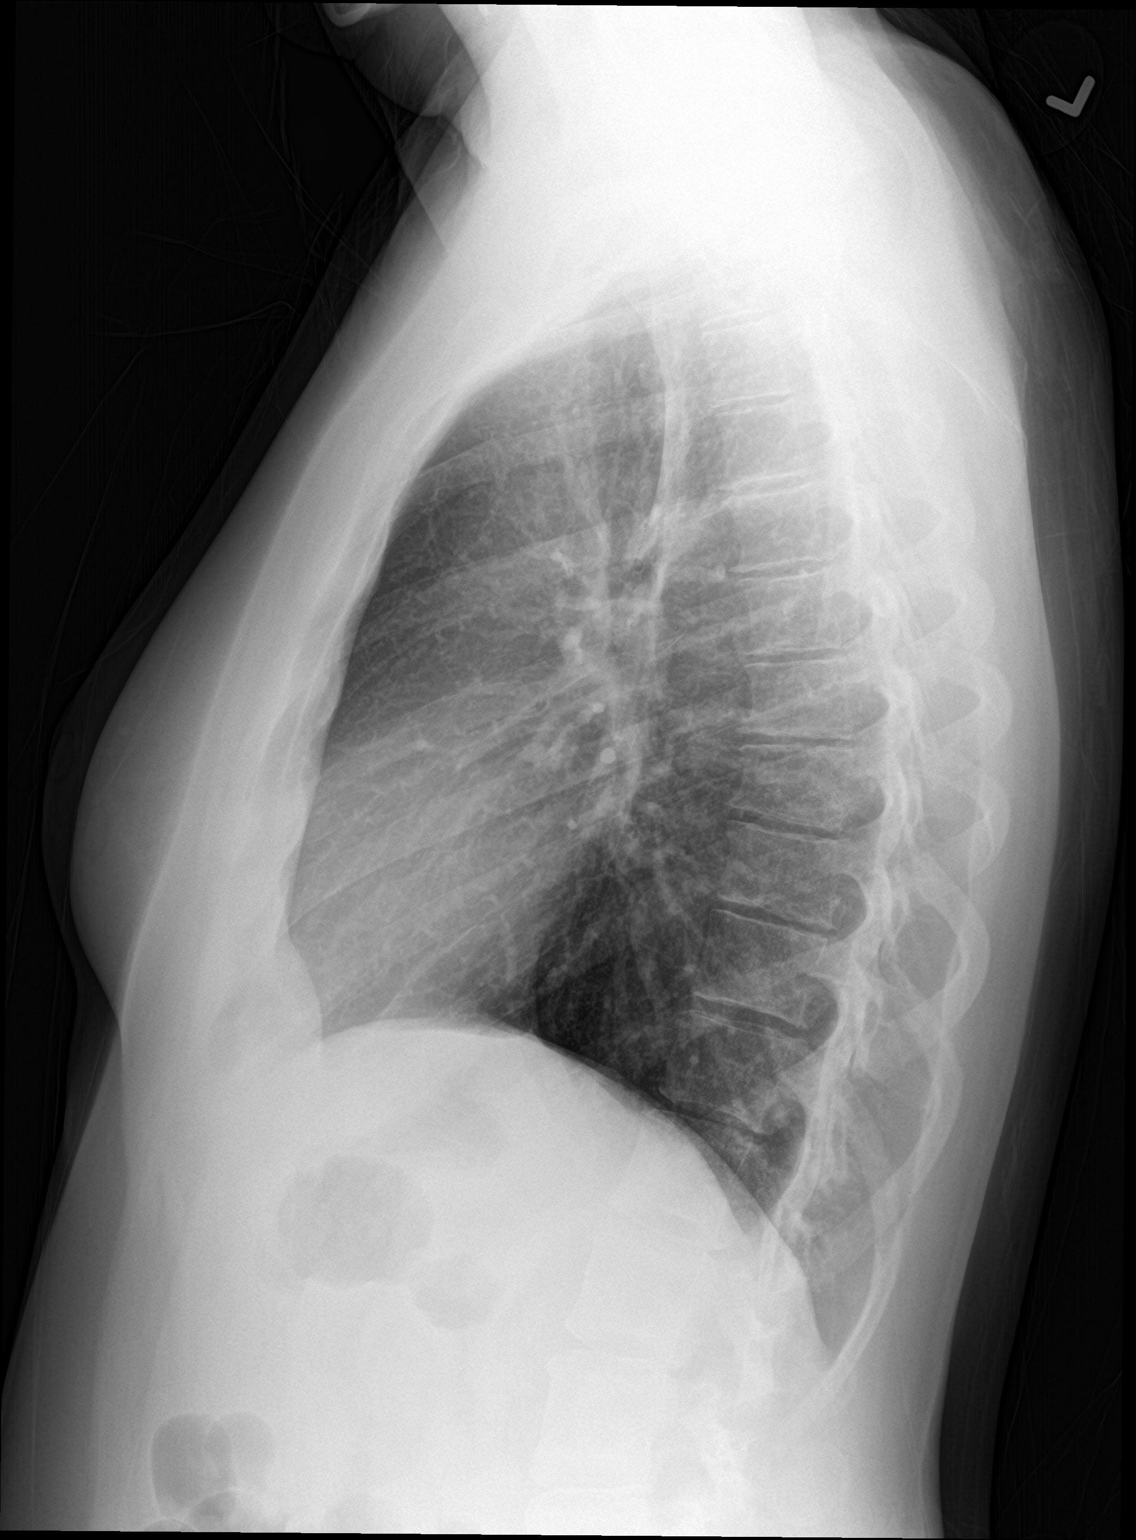

[2 of 2 positions shown; findings below may reference images not displayed]

FINDINGS: The heart size and mediastinal contours are within normal limits.
Both lungs are clear. The visualized skeletal structures are
unremarkable.
IMPRESSION: No active cardiopulmonary disease.
